# Patient Record
Sex: Female | Born: 1950 | Race: White | Hispanic: No | Marital: Married | Smoking: Former smoker
Health system: Southern US, Community
[De-identification: ages and names within clinical notes are randomized; demographics above are authoritative.]

## PROBLEM LIST (undated history)

## (undated) DIAGNOSIS — E785 Hyperlipidemia, unspecified: Secondary | ICD-10-CM

## (undated) DIAGNOSIS — K219 Gastro-esophageal reflux disease without esophagitis: Secondary | ICD-10-CM

## (undated) DIAGNOSIS — E039 Hypothyroidism, unspecified: Secondary | ICD-10-CM

## (undated) DIAGNOSIS — I219 Acute myocardial infarction, unspecified: Secondary | ICD-10-CM

## (undated) DIAGNOSIS — Z9889 Other specified postprocedural states: Secondary | ICD-10-CM

## (undated) DIAGNOSIS — I071 Rheumatic tricuspid insufficiency: Secondary | ICD-10-CM

## (undated) DIAGNOSIS — I2510 Atherosclerotic heart disease of native coronary artery without angina pectoris: Secondary | ICD-10-CM

## (undated) DIAGNOSIS — I340 Nonrheumatic mitral (valve) insufficiency: Secondary | ICD-10-CM

## (undated) DIAGNOSIS — E119 Type 2 diabetes mellitus without complications: Secondary | ICD-10-CM

## (undated) DIAGNOSIS — M1712 Unilateral primary osteoarthritis, left knee: Secondary | ICD-10-CM

## (undated) DIAGNOSIS — Z8709 Personal history of other diseases of the respiratory system: Secondary | ICD-10-CM

## (undated) DIAGNOSIS — I119 Hypertensive heart disease without heart failure: Secondary | ICD-10-CM

## (undated) DIAGNOSIS — I351 Nonrheumatic aortic (valve) insufficiency: Secondary | ICD-10-CM

## (undated) DIAGNOSIS — C801 Malignant (primary) neoplasm, unspecified: Secondary | ICD-10-CM

## (undated) DIAGNOSIS — I10 Essential (primary) hypertension: Secondary | ICD-10-CM

## (undated) DIAGNOSIS — J189 Pneumonia, unspecified organism: Secondary | ICD-10-CM

## (undated) DIAGNOSIS — R112 Nausea with vomiting, unspecified: Secondary | ICD-10-CM

## (undated) DIAGNOSIS — I517 Cardiomegaly: Secondary | ICD-10-CM

## (undated) HISTORY — DX: Unilateral primary osteoarthritis, left knee: M17.12

## (undated) HISTORY — PX: THYROIDECTOMY, PARTIAL: SHX18

## (undated) HISTORY — PX: TUMOR REMOVAL: SHX12

## (undated) HISTORY — PX: COLONOSCOPY: SHX174

## (undated) HISTORY — PX: TONSILLECTOMY AND ADENOIDECTOMY: SUR1326

## (undated) HISTORY — PX: HEEL SPUR SURGERY: SHX665

## (undated) HISTORY — PX: UPPER GI ENDOSCOPY: SHX6162

## (undated) HISTORY — PX: OTHER SURGICAL HISTORY: SHX169

## (undated) HISTORY — PX: VAGINAL HYSTERECTOMY: SUR661

---

## 2002-07-01 DIAGNOSIS — I219 Acute myocardial infarction, unspecified: Secondary | ICD-10-CM

## 2002-07-01 HISTORY — DX: Acute myocardial infarction, unspecified: I21.9

## 2017-01-26 ENCOUNTER — Ambulatory Visit: Payer: Self-pay | Admitting: Orthopedic Surgery

## 2017-02-02 ENCOUNTER — Encounter: Payer: Self-pay | Admitting: Orthopedic Surgery

## 2017-02-02 ENCOUNTER — Ambulatory Visit: Payer: Self-pay | Admitting: Orthopedic Surgery

## 2017-02-02 DIAGNOSIS — M1712 Unilateral primary osteoarthritis, left knee: Secondary | ICD-10-CM | POA: Insufficient documentation

## 2017-02-02 HISTORY — DX: Unilateral primary osteoarthritis, left knee: M17.12

## 2017-02-02 NOTE — H&P (View-Only) (Signed)
TOTAL KNEE ADMISSION H&P  Patient is being admitted for left total knee arthroplasty.  Subjective:  Chief Complaint:left knee pain.  HPI: Pamela Burns, 66 y.o. female, has a history of pain and functional disability in the left knee due to arthritis and has failed non-surgical conservative treatments for greater than 12 weeks to includeNSAID's and/or analgesics, corticosteriod injections, flexibility and strengthening excercises, supervised PT with diminished ADL's post treatment, use of assistive devices, weight reduction as appropriate and activity modification.  Onset of symptoms was gradual, starting 5 years ago with gradually worsening course since that time. The patient noted no past surgery on the left knee(s).  Patient currently rates pain in the left knee(s) at 10 out of 10 with activity. Patient has night pain, worsening of pain with activity and weight bearing, pain that interferes with activities of daily living, pain with passive range of motion, crepitus and joint swelling.  Patient has evidence of subchondral cysts, subchondral sclerosis, periarticular osteophytes and joint space narrowing by imaging studies. There is no active infection.  Patient Active Problem List   Diagnosis Date Noted  . Osteoarthritis of left knee 02/02/2017   Past Medical History:  Diagnosis Date  . Aortic insufficiency    trace  . Cancer [facility])    thyroid right side  . Coronary artery disease   . Diabetes mellitus without complication ([place])   . GERD (gastroesophageal reflux disease)   . Hypertension   . Hypertensive heart disease   . Hypothyroidism   . Mitral insufficiency    trace  . Moderate concentric left ventricular hypertrophy   . Myocardial infarction ([place]) 07/01/2002  . Osteoarthritis of left knee 02/02/2017  . Osteoarthritis of left knee   . Pneumonia    history of  . PONV (postoperative nausea and vomiting)   . Tricuspid insufficiency    trace    Past Surgical History:  Procedure  Laterality Date  . COLONOSCOPY    . HEEL SPUR SURGERY    . right wrist surgery    . thumb fracture surgery    . THYROIDECTOMY, PARTIAL    . TONSILLECTOMY AND ADENOIDECTOMY    . TUMOR REMOVAL     lieomyoma  . UPPER GI ENDOSCOPY    . VAGINAL HYSTERECTOMY      Current Outpatient Medications  Medication Sig Dispense Refill Last Dose  . acetaminophen (TYLENOL) 500 MG tablet Take 1,000 mg by mouth every 6 (six) hours as needed for moderate pain or headache.     . clopidogrel (PLAVIX) 75 MG tablet Take 75 mg by mouth daily.     . diclofenac sodium (VOLTAREN) 1 % GEL Apply 1 application topically 3 (three) times daily.     Marland Kitchen esomeprazole (NEXIUM 24HR) 20 MG capsule Take 40 mg by mouth 2 (two) times daily.     Marland Kitchen levothyroxine (SYNTHROID, LEVOTHROID) 100 MCG tablet Take 100 mcg by mouth daily before breakfast.     . metFORMIN (GLUCOPHAGE) 500 MG tablet Take 500 mg by mouth daily with breakfast.     . metoprolol tartrate (LOPRESSOR) 50 MG tablet Take 50 mg by mouth daily.     . ranitidine (ZANTAC) 150 MG tablet Take 300 mg by mouth daily at 12 noon.     . rosuvastatin (CRESTOR) 5 MG tablet Take 5 mg by mouth daily.      No current facility-administered medications for this visit.    Facility-Administered Medications Ordered in Other Visits  Medication Dose Route Frequency Provider Last Rate Last Dose  .  chlorhexidine (HIBICLENS) 4 % liquid 4 application  60 mL Topical Once Rod Can, MD       Allergies  Allergen Reactions  . Shellfish Allergy Anaphylaxis and Swelling  . Benadryl [Diphenhydramine] Hives  . Codeine Nausea Only  . Lipitor [Atorvastatin] Other (See Comments)    Chest pain  . Lortab [Hydrocodone-Acetaminophen] Nausea And Vomiting  . Zocor [Simvastatin] Other (See Comments)    Chest pain    Social History   Tobacco Use  . Smoking status: Never Smoker  . Smokeless tobacco: Never Used  Substance Use Topics  . Alcohol use: No    Frequency: Never    No family  history on file.   Review of Systems  Constitutional: Negative.   HENT: Negative.   Eyes: Negative.   Respiratory: Negative.   Cardiovascular: Negative.   Gastrointestinal: Negative.   Genitourinary: Negative.   Musculoskeletal: Positive for joint pain.  Skin: Negative.   Neurological: Negative.   Endo/Heme/Allergies: Negative.   Psychiatric/Behavioral: Negative.     Objective:  Physical Exam  Vitals reviewed. Constitutional: She is oriented to person, place, and time. She appears well-developed and well-nourished.  HENT:  Head: Normocephalic and atraumatic.  Eyes: Conjunctivae and EOM are normal. Pupils are equal, round, and reactive to light.  Neck: Normal range of motion.  Cardiovascular: Normal rate, regular rhythm and intact distal pulses.  Respiratory: Effort normal. No respiratory distress.  GI: Soft. She exhibits no distension.  Genitourinary:  Genitourinary Comments: deferred  Musculoskeletal:       Left knee: She exhibits decreased range of motion, swelling, effusion and abnormal alignment. Tenderness found. Medial joint line and lateral joint line tenderness noted.  Neurological: She is alert and oriented to person, place, and time. She has normal reflexes.  Skin: Skin is warm and dry.  Psychiatric: She has a normal mood and affect. Her behavior is normal. Judgment and thought content normal.    Vital signs in last 24 hours: @VSRANGES @  Labs:   Estimated body mass index is 26.71 kg/m (pended) as calculated from the following:   Height as of 02/17/17: (P) 5\' 6"  (1.676 m).   Weight as of 02/17/17: (P) 75.1 kg (165 lb 8 oz).   Imaging Review Plain radiographs demonstrate severe degenerative joint disease of the left knee(s). The overall alignment issignificant varus. The bone quality appears to be adequate for age and reported activity level.  Assessment/Plan:  End stage arthritis, left knee   The patient history, physical examination, clinical  judgment of the provider and imaging studies are consistent with end stage degenerative joint disease of the left knee(s) and total knee arthroplasty is deemed medically necessary. The treatment options including medical management, injection therapy arthroscopy and arthroplasty were discussed at length. The risks and benefits of total knee arthroplasty were presented and reviewed. The risks due to aseptic loosening, infection, stiffness, patella tracking problems, thromboembolic complications and other imponderables were discussed. The patient acknowledged the explanation, agreed to proceed with the plan and consent was signed. Patient is being admitted for inpatient treatment for surgery, pain control, PT, OT, prophylactic antibiotics, VTE prophylaxis, progressive ambulation and ADL's and discharge planning. The patient is planning to be discharged home with home health services. Has DME.

## 2017-02-02 NOTE — H&P (Signed)
TOTAL KNEE ADMISSION H&P  Patient is being admitted for left total knee arthroplasty.  Subjective:  Chief Complaint:left knee pain.  HPI: Pamela Burns, 66 y.o. female, has a history of pain and functional disability in the left knee due to arthritis and has failed non-surgical conservative treatments for greater than 12 weeks to includeNSAID's and/or analgesics, corticosteriod injections, flexibility and strengthening excercises, supervised PT with diminished ADL's post treatment, use of assistive devices, weight reduction as appropriate and activity modification.  Onset of symptoms was gradual, starting 5 years ago with gradually worsening course since that time. The patient noted no past surgery on the left knee(s).  Patient currently rates pain in the left knee(s) at 10 out of 10 with activity. Patient has night pain, worsening of pain with activity and weight bearing, pain that interferes with activities of daily living, pain with passive range of motion, crepitus and joint swelling.  Patient has evidence of subchondral cysts, subchondral sclerosis, periarticular osteophytes and joint space narrowing by imaging studies. There is no active infection.  Patient Active Problem List   Diagnosis Date Noted  . Osteoarthritis of left knee 02/02/2017   Past Medical History:  Diagnosis Date  . Aortic insufficiency    trace  . 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 Coronary artery disease   . Diabetes mellitus without complication ([place])   . GERD (gastroesophageal reflux disease)   . Hypertension   . Hypertensive heart disease   . Hypothyroidism   . Mitral insufficiency    trace  . Moderate concentric left ventricular hypertrophy   . Myocardial infarction ([place]) 07/01/2002  . Osteoarthritis of left knee 02/02/2017  . Osteoarthritis of left knee   . Pneumonia    history of  . PONV (postoperative nausea and vomiting)   . Tricuspid insufficiency    trace    Past Surgical History:  Procedure  Laterality Date  . COLONOSCOPY    . HEEL SPUR SURGERY    . right wrist surgery    . thumb fracture surgery    . THYROIDECTOMY, PARTIAL    . TONSILLECTOMY AND ADENOIDECTOMY    . TUMOR REMOVAL     lieomyoma  . UPPER GI ENDOSCOPY    . VAGINAL HYSTERECTOMY      Current Outpatient Medications  Medication Sig Dispense Refill Last Dose  . acetaminophen (TYLENOL) 500 MG tablet Take 1,000 mg by mouth every 6 (six) hours as needed for moderate pain or headache.     . clopidogrel (PLAVIX) 75 MG tablet Take 75 mg by mouth daily.     . diclofenac sodium (VOLTAREN) 1 % GEL Apply 1 application topically 3 (three) times daily.     Marland Kitchen esomeprazole (NEXIUM 24HR) 20 MG capsule Take 40 mg by mouth 2 (two) times daily.     Marland Kitchen levothyroxine (SYNTHROID, LEVOTHROID) 100 MCG tablet Take 100 mcg by mouth daily before breakfast.     . metFORMIN (GLUCOPHAGE) 500 MG tablet Take 500 mg by mouth daily with breakfast.     . metoprolol tartrate (LOPRESSOR) 50 MG tablet Take 50 mg by mouth daily.     . ranitidine (ZANTAC) 150 MG tablet Take 300 mg by mouth daily at 12 noon.     . rosuvastatin (CRESTOR) 5 MG tablet Take 5 mg by mouth daily.      No current facility-administered medications for this visit.    Facility-Administered Medications Ordered in Other Visits  Medication Dose Route Frequency Provider Last Rate Last Dose  .  chlorhexidine (HIBICLENS) 4 % liquid 4 application  60 mL Topical Once Rod Can, MD       Allergies  Allergen Reactions  . Shellfish Allergy Anaphylaxis and Swelling  . Benadryl [Diphenhydramine] Hives  . Codeine Nausea Only  . Lipitor [Atorvastatin] Other (See Comments)    Chest pain  . Lortab [Hydrocodone-Acetaminophen] Nausea And Vomiting  . Zocor [Simvastatin] Other (See Comments)    Chest pain    Social History   Tobacco Use  . Smoking status: Never Smoker  . Smokeless tobacco: Never Used  Substance Use Topics  . Alcohol use: No    Frequency: Never    No family  history on file.   Review of Systems  Constitutional: Negative.   HENT: Negative.   Eyes: Negative.   Respiratory: Negative.   Cardiovascular: Negative.   Gastrointestinal: Negative.   Genitourinary: Negative.   Musculoskeletal: Positive for joint pain.  Skin: Negative.   Neurological: Negative.   Endo/Heme/Allergies: Negative.   Psychiatric/Behavioral: Negative.     Objective:  Physical Exam  Vitals reviewed. Constitutional: She is oriented to person, place, and time. She appears well-developed and well-nourished.  HENT:  Head: Normocephalic and atraumatic.  Eyes: Conjunctivae and EOM are normal. Pupils are equal, round, and reactive to light.  Neck: Normal range of motion.  Cardiovascular: Normal rate, regular rhythm and intact distal pulses.  Respiratory: Effort normal. No respiratory distress.  GI: Soft. She exhibits no distension.  Genitourinary:  Genitourinary Comments: deferred  Musculoskeletal:       Left knee: She exhibits decreased range of motion, swelling, effusion and abnormal alignment. Tenderness found. Medial joint line and lateral joint line tenderness noted.  Neurological: She is alert and oriented to person, place, and time. She has normal reflexes.  Skin: Skin is warm and dry.  Psychiatric: She has a normal mood and affect. Her behavior is normal. Judgment and thought content normal.    Vital signs in last 24 hours: @VSRANGES @  Labs:   Estimated body mass index is 26.71 kg/m (pended) as calculated from the following:   Height as of 02/17/17: (P) 5\' 6"  (1.676 m).   Weight as of 02/17/17: (P) 75.1 kg (165 lb 8 oz).   Imaging Review Plain radiographs demonstrate severe degenerative joint disease of the left knee(s). The overall alignment issignificant varus. The bone quality appears to be adequate for age and reported activity level.  Assessment/Plan:  End stage arthritis, left knee   The patient history, physical examination, clinical  judgment of the provider and imaging studies are consistent with end stage degenerative joint disease of the left knee(s) and total knee arthroplasty is deemed medically necessary. The treatment options including medical management, injection therapy arthroscopy and arthroplasty were discussed at length. The risks and benefits of total knee arthroplasty were presented and reviewed. The risks due to aseptic loosening, infection, stiffness, patella tracking problems, thromboembolic complications and other imponderables were discussed. The patient acknowledged the explanation, agreed to proceed with the plan and consent was signed. Patient is being admitted for inpatient treatment for surgery, pain control, PT, OT, prophylactic antibiotics, VTE prophylaxis, progressive ambulation and ADL's and discharge planning. The patient is planning to be discharged home with home health services. Has DME.

## 2017-02-15 ENCOUNTER — Encounter (HOSPITAL_COMMUNITY): Payer: Self-pay

## 2017-02-15 NOTE — Pre-Procedure Instructions (Signed)
Surgical clearance in Nov. 2018 from Dr. Michell Heinrich in chart.

## 2017-02-15 NOTE — Patient Instructions (Addendum)
Pamela Burns  02/15/2017   Your procedure is scheduled on: Thursday, Dec. 27, 2018   Report to [facility] Main  Entrance   Take [place]  elevators to 3rd floor to  [place] at 10:30 AM.    Call this number if you have problems the morning of surgery [phone]   Remember: ONLY 1 PERSON MAY GO WITH YOU TO SHORT STAY TO GET  READY MORNING OF Pamela Burns.   Do not eat food or drink liquids :After Midnight.    Take these medicines the morning of surgery with A SIP OF WATER: Nexium, Levothyroxine,   DO NOT TAKE ANY DIABETIC MEDICATIONS DAY OF YOUR SURGERY                               You may not have any metal on your body including hair pins, jewelry, and body piercings               Do not wear make-up, lotions, powders, perfumes, or deodorant             Do not wear nail polish.  Do not shave  48 hours prior to surgery.                 Do not bring valuables to the hospital. [place].   Contacts, dentures or bridgework may not be worn into surgery.   Leave suitcase in the car. After surgery it may be brought to your room.               Please read over the following fact sheets you were given: _____________________________________________________________________        [facility] - Preparing for Surgery Before surgery, you can play an important role.  Because skin is not sterile, your skin needs to be as free of germs as possible.  You can reduce the number of germs on your skin by washing with CHG (chlorahexidine gluconate) soap before surgery.  CHG is an antiseptic cleaner which kills germs and bonds with the skin to continue killing germs even after washing. Please DO NOT use if you have an allergy to CHG or antibacterial soaps.  If your skin becomes reddened/irritated stop using the CHG and inform your nurse when you arrive at Short Stay. Do not shave (including legs and underarms) for at  least 48 hours prior to the first CHG shower.  You may shave your face/neck.  Please follow these instructions carefully:  1.  Shower with CHG Soap the night before surgery and the  morning of surgery.  2.  If you choose to wash your hair, wash your hair first as usual with your normal  shampoo.  3.  After you shampoo, rinse your hair and body thoroughly to remove the shampoo.                             4.  Use CHG as you would any other liquid soap.  You can apply chg directly to the skin and wash.  Gently with a scrungie or clean washcloth.  5.  Apply the CHG Soap to your body ONLY FROM THE NECK DOWN.   Do  not use on face/ open                           Wound or open sores. Avoid contact with eyes, ears mouth and   genitals (private parts).                       Wash face,  Genitals (private parts) with your normal soap.             6.  Wash thoroughly, paying special attention to the area where your    surgery  will be performed.  7.  Thoroughly rinse your body with warm water from the neck down.  8.  DO NOT shower/wash with your normal soap after using and rinsing off the CHG Soap.                9.  Pat yourself dry with a clean towel.            10.  Wear clean pajamas.            11.  Place clean sheets on your bed the night of your first shower and do not  sleep with pets. Day of Surgery : Do not apply any lotions/deodorants the morning of surgery.  Please wear clean clothes to the hospital/surgery center.  FAILURE TO FOLLOW THESE INSTRUCTIONS MAY RESULT IN THE CANCELLATION OF YOUR SURGERY  PATIENT SIGNATURE_________________________________  NURSE SIGNATURE__________________________________  ________________________________________________________________________   Pamela Burns  An incentive spirometer is a tool that can help keep your lungs clear and active. This tool measures how well you are filling your lungs with each breath. Taking long deep breaths may help  reverse or decrease the chance of developing breathing (pulmonary) problems (especially infection) following:  A long period of time when you are unable to move or be active. BEFORE THE PROCEDURE   If the spirometer includes an indicator to show your best effort, your nurse or respiratory therapist will set it to a desired goal.  If possible, sit up straight or lean slightly forward. Try not to slouch.  Hold the incentive spirometer in an upright position. INSTRUCTIONS FOR USE  1. Sit on the edge of your bed if possible, or sit up as far as you can in bed or on a chair. 2. Hold the incentive spirometer in an upright position. 3. Breathe out normally. 4. Place the mouthpiece in your mouth and seal your lips tightly around it. 5. Breathe in slowly and as deeply as possible, raising the piston or the ball toward the top of the column. 6. Hold your breath for 3-5 seconds or for as long as possible. Allow the piston or ball to fall to the bottom of the column. 7. Remove the mouthpiece from your mouth and breathe out normally. 8. Rest for a few seconds and repeat Steps 1 through 7 at least 10 times every 1-2 hours when you are awake. Take your time and take a few normal breaths between deep breaths. 9. The spirometer may include an indicator to show your best effort. Use the indicator as a goal to work toward during each repetition. 10. After each set of 10 deep breaths, practice coughing to be sure your lungs are clear. If you have an incision (the cut made at the time of surgery), support your incision when coughing by placing a pillow or rolled up towels firmly against it. Once you are able  to get out of bed, walk around indoors and cough well. You may stop using the incentive spirometer when instructed by your caregiver.  RISKS AND COMPLICATIONS  Take your time so you do not get dizzy or light-headed.  If you are in pain, you may need to take or ask for pain medication before doing incentive  spirometry. It is harder to take a deep breath if you are having pain. AFTER USE  Rest and breathe slowly and easily.  It can be helpful to keep track of a log of your progress. Your caregiver can provide you with a simple table to help with this. If you are using the spirometer at home, follow these instructions: [place] IF:   You are having difficultly using the spirometer.  You have trouble using the spirometer as often as instructed.  Your pain medication is not giving enough relief while using the spirometer.  You develop fever of 100.5 F (38.1 C) or higher. SEEK IMMEDIATE MEDICAL CARE IF:   You cough up bloody sputum that had not been present before.  You develop fever of 102 F (38.9 C) or greater.  You develop worsening pain at or near the incision site. MAKE SURE YOU:   Understand these instructions.  Will watch your condition.  Will get help right away if you are not doing well or get worse. Document Released: 06/29/2006 Document Revised: 05/11/2011 Document Reviewed: 08/30/2006 Exit[employer] Patient Information 2014 ExitCare, [place].   ________________________________________________________________________  WHAT IS A BLOOD TRANSFUSION? Blood Transfusion Information  A transfusion is the replacement of blood or some of its parts. Blood is made up of multiple cells which provide different functions.  Red blood cells carry oxygen and are used for blood loss replacement.  White blood cells fight against infection.  Platelets control bleeding.  Plasma helps clot blood.  Other blood products are available for specialized needs, such as hemophilia or other clotting disorders. BEFORE THE TRANSFUSION  Who gives blood for transfusions?   Healthy volunteers who are fully evaluated to make sure their blood is safe. This is blood bank blood. Transfusion therapy is the safest it has ever been in the practice of medicine. Before blood is taken from a donor, a  complete history is taken to make sure that person has no history of diseases nor engages in risky social behavior (examples are intravenous drug use or sexual activity with multiple partners). The donor's travel history is screened to minimize risk of transmitting infections, such as malaria. The donated blood is tested for signs of infectious diseases, such as HIV and hepatitis. The blood is then tested to be sure it is compatible with you in order to minimize the chance of a transfusion reaction. If you or a relative donates blood, this is often done in anticipation of surgery and is not appropriate for emergency situations. It takes many days to process the donated blood. RISKS AND COMPLICATIONS Although transfusion therapy is very safe and saves many lives, the main dangers of transfusion include:   Getting an infectious disease.  Developing a transfusion reaction. This is an allergic reaction to something in the blood you were given. Every precaution is taken to prevent this. The decision to have a blood transfusion has been considered carefully by your caregiver before blood is given. Blood is not given unless the benefits outweigh the risks. AFTER THE TRANSFUSION  Right after receiving a blood transfusion, you will usually feel much better and more energetic. This is especially true  if your red blood cells have gotten low (anemic). The transfusion raises the level of the red blood cells which carry oxygen, and this usually causes an energy increase.  The nurse administering the transfusion will monitor you carefully for complications. HOME CARE INSTRUCTIONS  No special instructions are needed after a transfusion. You may find your energy is better. Speak with your caregiver about any limitations on activity for underlying diseases you may have. SEEK MEDICAL CARE IF:   Your condition is not improving after your transfusion.  You develop redness or irritation at the intravenous (IV)  site. SEEK IMMEDIATE MEDICAL CARE IF:  Any of the following symptoms occur over the next 12 hours:  Shaking chills.  You have a temperature by mouth above 102 F (38.9 C), not controlled by medicine.  Chest, back, or muscle pain.  People around you feel you are not acting correctly or are confused.  Shortness of breath or difficulty breathing.  Dizziness and fainting.  You get a rash or develop hives.  You have a decrease in urine output.  Your urine turns a dark color or changes to pink, red, or brown. Any of the following symptoms occur over the next 10 days:  You have a temperature by mouth above 102 F (38.9 C), not controlled by medicine.  Shortness of breath.  Weakness after normal activity.  The white part of the eye turns yellow (jaundice).  You have a decrease in the amount of urine or are urinating less often.  Your urine turns a dark color or changes to pink, red, or brown. Document Released: 02/14/2000 Document Revised: 05/11/2011 Document Reviewed: 10/03/2007 [facility] Patient Information 2014 [place], [place].  _______________________________________________________________________

## 2017-02-15 NOTE — Pre-Procedure Instructions (Signed)
Echo report from 01/04/17 in chart

## 2017-02-17 ENCOUNTER — Other Ambulatory Visit: Payer: Self-pay

## 2017-02-17 ENCOUNTER — Encounter (HOSPITAL_COMMUNITY): Payer: Self-pay

## 2017-02-17 ENCOUNTER — Encounter (HOSPITAL_COMMUNITY)
Admission: RE | Admit: 2017-02-17 | Discharge: 2017-02-17 | Disposition: A | Discharge status: Home / Self Care | Payer: Medicare Other | Source: Ambulatory Visit | Attending: Orthopedic Surgery | Admitting: Orthopedic Surgery

## 2017-02-17 DIAGNOSIS — Z01810 Encounter for preprocedural cardiovascular examination: Secondary | ICD-10-CM | POA: Diagnosis not present

## 2017-02-17 DIAGNOSIS — E119 Type 2 diabetes mellitus without complications: Secondary | ICD-10-CM | POA: Diagnosis not present

## 2017-02-17 DIAGNOSIS — Z01812 Encounter for preprocedural laboratory examination: Secondary | ICD-10-CM | POA: Diagnosis present

## 2017-02-17 HISTORY — DX: Type 2 diabetes mellitus without complications: E11.9

## 2017-02-17 HISTORY — DX: Nonrheumatic mitral (valve) insufficiency: I34.0

## 2017-02-17 HISTORY — DX: Rheumatic tricuspid insufficiency: I07.1

## 2017-02-17 HISTORY — DX: Nonrheumatic aortic (valve) insufficiency: I35.1

## 2017-02-17 HISTORY — DX: Nausea with vomiting, unspecified: R11.2

## 2017-02-17 HISTORY — DX: Pneumonia, unspecified organism: J18.9

## 2017-02-17 HISTORY — DX: Acute myocardial infarction, unspecified: I21.9

## 2017-02-17 HISTORY — DX: Nausea with vomiting, unspecified: Z98.890

## 2017-02-17 HISTORY — DX: Cardiomegaly: I51.7

## 2017-02-17 HISTORY — DX: Malignant (primary) neoplasm, unspecified: C80.1

## 2017-02-17 HISTORY — DX: Hypothyroidism, unspecified: E03.9

## 2017-02-17 HISTORY — DX: Essential (primary) hypertension: I10

## 2017-02-17 HISTORY — DX: Hypertensive heart disease without heart failure: I11.9

## 2017-02-17 HISTORY — DX: Atherosclerotic heart disease of native coronary artery without angina pectoris: I25.10

## 2017-02-17 HISTORY — DX: Gastro-esophageal reflux disease without esophagitis: K21.9

## 2017-02-17 LAB — CBC
HCT: 33 % — ABNORMAL LOW (ref 36.0–46.0)
Hemoglobin: 11.2 g/dL — ABNORMAL LOW (ref 12.0–15.0)
MCH: 28.7 pg (ref 26.0–34.0)
MCHC: 33.9 g/dL (ref 30.0–36.0)
MCV: 84.6 fL (ref 78.0–100.0)
PLATELETS: 269 10*3/uL (ref 150–400)
RBC: 3.9 MIL/uL (ref 3.87–5.11)
RDW: 12.7 % (ref 11.5–15.5)
WBC: 6.5 10*3/uL (ref 4.0–10.5)

## 2017-02-17 LAB — SURGICAL PCR SCREEN
MRSA, PCR: NEGATIVE
STAPHYLOCOCCUS AUREUS: NEGATIVE

## 2017-02-17 LAB — BASIC METABOLIC PANEL
Anion gap: 7 (ref 5–15)
BUN: 11 mg/dL (ref 6–20)
CALCIUM: 9.2 mg/dL (ref 8.9–10.3)
CHLORIDE: 106 mmol/L (ref 101–111)
CO2: 25 mmol/L (ref 22–32)
CREATININE: 0.57 mg/dL (ref 0.44–1.00)
GFR calc Af Amer: >60 mL/min (ref >60)
GFR calc non Af Amer: >60 mL/min (ref >60)
Glucose, Bld: 131 mg/dL — ABNORMAL HIGH (ref 65–99)
Potassium: 4 mmol/L (ref 3.5–5.1)
SODIUM: 138 mmol/L (ref 135–145)

## 2017-02-17 LAB — HEMOGLOBIN A1C
HEMOGLOBIN A1C: 6.4 % — AB (ref 4.8–5.6)
Mean Plasma Glucose: 136.98 mg/dL

## 2017-02-17 LAB — ABO/RH: ABO/RH(D): A POS

## 2017-02-17 LAB — GLUCOSE, CAPILLARY: Glucose-Capillary: 136 mg/dL — ABNORMAL HIGH (ref 65–99)

## 2017-02-17 MED ORDER — CHLORHEXIDINE GLUCONATE 4 % EX LIQD
60.0000 mL | Freq: Once | CUTANEOUS | Status: DC
  Filled 2017-02-17: qty 60

## 2017-02-17 NOTE — Pre-Procedure Instructions (Signed)
CBC, BMP, HGB A1C results faxed to Dr. Lyla Glassing via epic.

## 2017-02-25 ENCOUNTER — Inpatient Hospital Stay (HOSPITAL_COMMUNITY): Payer: Medicare Other

## 2017-02-25 ENCOUNTER — Inpatient Hospital Stay (HOSPITAL_COMMUNITY): Payer: Medicare Other | Admitting: Anesthesiology

## 2017-02-25 ENCOUNTER — Inpatient Hospital Stay (HOSPITAL_COMMUNITY)
Admission: RE | Admit: 2017-02-25 | Discharge: 2017-02-27 | DRG: 470 | Disposition: A | Discharge status: Home / Self Care | Payer: Medicare Other | Source: Ambulatory Visit | Attending: Orthopedic Surgery | Admitting: Orthopedic Surgery

## 2017-02-25 ENCOUNTER — Encounter (HOSPITAL_COMMUNITY): Payer: Self-pay | Admitting: *Deleted

## 2017-02-25 ENCOUNTER — Encounter (HOSPITAL_COMMUNITY)
Admission: RE | Disposition: A | Discharge status: Home / Self Care | Payer: Self-pay | Source: Ambulatory Visit | Attending: Orthopedic Surgery

## 2017-02-25 ENCOUNTER — Other Ambulatory Visit: Payer: Self-pay

## 2017-02-25 DIAGNOSIS — E039 Hypothyroidism, unspecified: Secondary | ICD-10-CM | POA: Diagnosis present

## 2017-02-25 DIAGNOSIS — Z87891 Personal history of nicotine dependence: Secondary | ICD-10-CM

## 2017-02-25 DIAGNOSIS — I119 Hypertensive heart disease without heart failure: Secondary | ICD-10-CM | POA: Diagnosis present

## 2017-02-25 DIAGNOSIS — I2510 Atherosclerotic heart disease of native coronary artery without angina pectoris: Secondary | ICD-10-CM | POA: Diagnosis present

## 2017-02-25 DIAGNOSIS — E119 Type 2 diabetes mellitus without complications: Secondary | ICD-10-CM | POA: Diagnosis present

## 2017-02-25 DIAGNOSIS — I083 Combined rheumatic disorders of mitral, aortic and tricuspid valves: Secondary | ICD-10-CM | POA: Diagnosis present

## 2017-02-25 DIAGNOSIS — M1712 Unilateral primary osteoarthritis, left knee: Secondary | ICD-10-CM | POA: Diagnosis present

## 2017-02-25 DIAGNOSIS — Z85850 Personal history of malignant neoplasm of thyroid: Secondary | ICD-10-CM

## 2017-02-25 DIAGNOSIS — K219 Gastro-esophageal reflux disease without esophagitis: Secondary | ICD-10-CM | POA: Diagnosis present

## 2017-02-25 DIAGNOSIS — D649 Anemia, unspecified: Secondary | ICD-10-CM | POA: Diagnosis present

## 2017-02-25 DIAGNOSIS — Z8701 Personal history of pneumonia (recurrent): Secondary | ICD-10-CM

## 2017-02-25 DIAGNOSIS — Z91013 Allergy to seafood: Secondary | ICD-10-CM | POA: Diagnosis not present

## 2017-02-25 DIAGNOSIS — Z888 Allergy status to other drugs, medicaments and biological substances status: Secondary | ICD-10-CM

## 2017-02-25 DIAGNOSIS — Z90710 Acquired absence of both cervix and uterus: Secondary | ICD-10-CM

## 2017-02-25 DIAGNOSIS — Z7902 Long term (current) use of antithrombotics/antiplatelets: Secondary | ICD-10-CM

## 2017-02-25 DIAGNOSIS — Z9089 Acquired absence of other organs: Secondary | ICD-10-CM | POA: Diagnosis not present

## 2017-02-25 DIAGNOSIS — Z96642 Presence of left artificial hip joint: Secondary | ICD-10-CM

## 2017-02-25 DIAGNOSIS — I252 Old myocardial infarction: Secondary | ICD-10-CM

## 2017-02-25 DIAGNOSIS — Z885 Allergy status to narcotic agent status: Secondary | ICD-10-CM | POA: Diagnosis not present

## 2017-02-25 HISTORY — PX: KNEE ARTHROPLASTY: SHX992

## 2017-02-25 LAB — GLUCOSE, CAPILLARY
GLUCOSE-CAPILLARY: 97 mg/dL (ref 65–99)
GLUCOSE-CAPILLARY: 127 mg/dL — AB (ref 65–99)
GLUCOSE-CAPILLARY: 234 mg/dL — AB (ref 65–99)

## 2017-02-25 LAB — PREPARE RBC (CROSSMATCH)

## 2017-02-25 SURGERY — ARTHROPLASTY, KNEE, TOTAL, USING IMAGELESS COMPUTER-ASSISTED NAVIGATION
Anesthesia: Spinal | Site: Knee | Laterality: Left

## 2017-02-25 MED ORDER — PROPOFOL 10 MG/ML IV BOLUS
INTRAVENOUS | Status: AC
  Filled 2017-02-25: qty 20

## 2017-02-25 MED ORDER — ONDANSETRON HCL 4 MG/2ML IJ SOLN
4.0000 mg | Freq: Four times a day (QID) | INTRAMUSCULAR | Status: DC | PRN
  Administered 2017-02-25 – 2017-02-26 (×2): 4 mg via INTRAVENOUS
  Filled 2017-02-25 (×2): qty 2

## 2017-02-25 MED ORDER — METOCLOPRAMIDE HCL 5 MG/ML IJ SOLN: 10.0000 mg | Freq: Once | INTRAMUSCULAR | Status: DC | PRN

## 2017-02-25 MED ORDER — POVIDONE-IODINE 10 % EX SWAB
2.0000 "application " | Freq: Once | CUTANEOUS | Status: AC
  Administered 2017-02-25: 2 via TOPICAL

## 2017-02-25 MED ORDER — PHENYLEPHRINE HCL 10 MG/ML IJ SOLN
INTRAVENOUS | Status: DC | PRN
  Administered 2017-02-25: 20 ug/min via INTRAVENOUS

## 2017-02-25 MED ORDER — CEFAZOLIN SODIUM-DEXTROSE 2-4 GM/100ML-% IV SOLN
2.0000 g | INTRAVENOUS | Status: AC
  Administered 2017-02-25: 2 g via INTRAVENOUS
  Filled 2017-02-25: qty 100

## 2017-02-25 MED ORDER — METHOCARBAMOL 1000 MG/10ML IJ SOLN
500.0000 mg | Freq: Four times a day (QID) | INTRAMUSCULAR | Status: DC | PRN
  Administered 2017-02-25: 500 mg via INTRAVENOUS
  Filled 2017-02-25: qty 550

## 2017-02-25 MED ORDER — DOCUSATE SODIUM 100 MG PO CAPS
100.0000 mg | ORAL_CAPSULE | Freq: Two times a day (BID) | ORAL | Status: DC
  Administered 2017-02-25 – 2017-02-27 (×4): 100 mg via ORAL
  Filled 2017-02-25 (×4): qty 1

## 2017-02-25 MED ORDER — ONDANSETRON HCL 4 MG/2ML IJ SOLN
INTRAMUSCULAR | Status: AC
  Filled 2017-02-25: qty 2

## 2017-02-25 MED ORDER — METOPROLOL TARTRATE 50 MG PO TABS
50.0000 mg | ORAL_TABLET | ORAL | Status: AC
  Administered 2017-02-25: 50 mg via ORAL
  Filled 2017-02-25: qty 1

## 2017-02-25 MED ORDER — FENTANYL CITRATE (PF) 100 MCG/2ML IJ SOLN
INTRAMUSCULAR | Status: AC
Start: 2017-02-25 — End: 2017-02-26
  Filled 2017-02-25: qty 2

## 2017-02-25 MED ORDER — SODIUM CHLORIDE 0.9 % IR SOLN
Status: DC | PRN
  Administered 2017-02-25: 4000 mL

## 2017-02-25 MED ORDER — CEFAZOLIN SODIUM-DEXTROSE 1-4 GM/50ML-% IV SOLN
1.0000 g | Freq: Four times a day (QID) | INTRAVENOUS | Status: AC
  Administered 2017-02-25 – 2017-02-26 (×2): 1 g via INTRAVENOUS
  Filled 2017-02-25 (×2): qty 50

## 2017-02-25 MED ORDER — SODIUM CHLORIDE 0.9 % IV SOLN
INTRAVENOUS | Status: DC
  Administered 2017-02-25 – 2017-02-26 (×2): via INTRAVENOUS

## 2017-02-25 MED ORDER — PROPOFOL 500 MG/50ML IV EMUL
INTRAVENOUS | Status: DC | PRN
  Administered 2017-02-25: 50 ug/kg/min via INTRAVENOUS

## 2017-02-25 MED ORDER — ASPIRIN 81 MG PO CHEW
81.0000 mg | CHEWABLE_TABLET | Freq: Two times a day (BID) | ORAL | Status: DC
  Administered 2017-02-25 – 2017-02-27 (×4): 81 mg via ORAL
  Filled 2017-02-25 (×4): qty 1

## 2017-02-25 MED ORDER — KETOROLAC TROMETHAMINE 30 MG/ML IJ SOLN
INTRAMUSCULAR | Status: DC | PRN
  Administered 2017-02-25: 30 mg

## 2017-02-25 MED ORDER — ONDANSETRON HCL 4 MG/2ML IJ SOLN
INTRAMUSCULAR | Status: DC | PRN
  Administered 2017-02-25: 4 mg via INTRAVENOUS

## 2017-02-25 MED ORDER — KETOROLAC TROMETHAMINE 15 MG/ML IJ SOLN
15.0000 mg | Freq: Four times a day (QID) | INTRAMUSCULAR | Status: AC
  Administered 2017-02-25 – 2017-02-26 (×4): 15 mg via INTRAVENOUS
  Filled 2017-02-25 (×4): qty 1

## 2017-02-25 MED ORDER — ROPIVACAINE HCL 7.5 MG/ML IJ SOLN
INTRAMUSCULAR | Status: DC | PRN
  Administered 2017-02-25: 20 mL via PERINEURAL

## 2017-02-25 MED ORDER — SODIUM CHLORIDE 0.9 % IJ SOLN
INTRAMUSCULAR | Status: DC | PRN
  Administered 2017-02-25: 30 mL

## 2017-02-25 MED ORDER — DIPHENHYDRAMINE HCL 12.5 MG/5ML PO ELIX: 12.5000 mg | ORAL_SOLUTION | ORAL | Status: DC | PRN

## 2017-02-25 MED ORDER — METOPROLOL TARTRATE 50 MG PO TABS
50.0000 mg | ORAL_TABLET | Freq: Every day | ORAL | Status: DC
  Administered 2017-02-26 – 2017-02-27 (×2): 50 mg via ORAL
  Filled 2017-02-25 (×2): qty 1

## 2017-02-25 MED ORDER — STERILE WATER FOR IRRIGATION IR SOLN
Status: DC | PRN
  Administered 2017-02-25: 2000 mL

## 2017-02-25 MED ORDER — ONDANSETRON HCL 4 MG PO TABS: 4.0000 mg | ORAL_TABLET | Freq: Four times a day (QID) | ORAL | Status: DC | PRN

## 2017-02-25 MED ORDER — INSULIN ASPART 100 UNIT/ML ~~LOC~~ SOLN
0.0000 [IU] | Freq: Three times a day (TID) | SUBCUTANEOUS | Status: DC
  Administered 2017-02-26: 18:00:00 5 [IU] via SUBCUTANEOUS

## 2017-02-25 MED ORDER — DEXAMETHASONE SODIUM PHOSPHATE 10 MG/ML IJ SOLN
INTRAMUSCULAR | Status: AC
  Filled 2017-02-25: qty 1

## 2017-02-25 MED ORDER — ISOPROPYL ALCOHOL 70 % SOLN
Status: DC | PRN
  Administered 2017-02-25: 1 via TOPICAL

## 2017-02-25 MED ORDER — MEPERIDINE HCL 50 MG/ML IJ SOLN: 6.2500 mg | INTRAMUSCULAR | Status: DC | PRN

## 2017-02-25 MED ORDER — MENTHOL 3 MG MT LOZG
1.0000 | LOZENGE | OROMUCOSAL | Status: DC | PRN
Start: 2017-02-25 — End: 2017-02-27

## 2017-02-25 MED ORDER — ALUM & MAG HYDROXIDE-SIMETH 200-200-20 MG/5ML PO SUSP: 30.0000 mL | ORAL | Status: DC | PRN

## 2017-02-25 MED ORDER — SODIUM CHLORIDE 0.9 % IV SOLN: INTRAVENOUS | Status: DC

## 2017-02-25 MED ORDER — METFORMIN HCL 500 MG PO TABS
500.0000 mg | ORAL_TABLET | Freq: Every day | ORAL | Status: DC
  Administered 2017-02-26 – 2017-02-27 (×2): 500 mg via ORAL
  Filled 2017-02-25 (×2): qty 1

## 2017-02-25 MED ORDER — ISOPROPYL ALCOHOL 70 % SOLN
Status: AC
  Filled 2017-02-25: qty 480

## 2017-02-25 MED ORDER — POLYETHYLENE GLYCOL 3350 17 G PO PACK: 17.0000 g | PACK | Freq: Every day | ORAL | Status: DC | PRN

## 2017-02-25 MED ORDER — BUPIVACAINE-EPINEPHRINE 0.25% -1:200000 IJ SOLN
INTRAMUSCULAR | Status: DC | PRN
  Administered 2017-02-25: 30 mL

## 2017-02-25 MED ORDER — LACTATED RINGERS IV SOLN: INTRAVENOUS | Status: DC

## 2017-02-25 MED ORDER — ACETAMINOPHEN 10 MG/ML IV SOLN
1000.0000 mg | INTRAVENOUS | Status: AC
  Administered 2017-02-25: 1000 mg via INTRAVENOUS
  Filled 2017-02-25: qty 100

## 2017-02-25 MED ORDER — MIDAZOLAM HCL 2 MG/2ML IJ SOLN
INTRAMUSCULAR | Status: AC
  Filled 2017-02-25: qty 2

## 2017-02-25 MED ORDER — MIDAZOLAM HCL 2 MG/2ML IJ SOLN
1.0000 mg | INTRAMUSCULAR | Status: DC | PRN
  Administered 2017-02-25: 2 mg via INTRAVENOUS

## 2017-02-25 MED ORDER — SODIUM CHLORIDE 0.9 % IJ SOLN
INTRAMUSCULAR | Status: AC
  Filled 2017-02-25: qty 50

## 2017-02-25 MED ORDER — ROSUVASTATIN CALCIUM 5 MG PO TABS
5.0000 mg | ORAL_TABLET | Freq: Every day | ORAL | Status: DC
  Administered 2017-02-25 – 2017-02-26 (×2): 5 mg via ORAL
  Filled 2017-02-25 (×2): qty 1

## 2017-02-25 MED ORDER — DEXAMETHASONE SODIUM PHOSPHATE 10 MG/ML IJ SOLN
INTRAMUSCULAR | Status: DC | PRN
  Administered 2017-02-25: 10 mg via INTRAVENOUS

## 2017-02-25 MED ORDER — TRANEXAMIC ACID 1000 MG/10ML IV SOLN
1000.0000 mg | INTRAVENOUS | Status: AC
  Administered 2017-02-25: 1000 mg via INTRAVENOUS
  Filled 2017-02-25: qty 1100

## 2017-02-25 MED ORDER — OXYCODONE HCL 5 MG PO TABS: 5.0000 mg | ORAL_TABLET | ORAL | Status: DC | PRN

## 2017-02-25 MED ORDER — SODIUM CHLORIDE 0.9 % IV SOLN: Freq: Once | INTRAVENOUS | Status: DC

## 2017-02-25 MED ORDER — PANTOPRAZOLE SODIUM 40 MG PO TBEC
80.0000 mg | DELAYED_RELEASE_TABLET | Freq: Every day | ORAL | Status: DC
  Administered 2017-02-26: 13:00:00 80 mg via ORAL
  Filled 2017-02-25: qty 2

## 2017-02-25 MED ORDER — LACTATED RINGERS IV SOLN
INTRAVENOUS | Status: DC
  Administered 2017-02-25 (×2): via INTRAVENOUS

## 2017-02-25 MED ORDER — METHOCARBAMOL 500 MG PO TABS
500.0000 mg | ORAL_TABLET | Freq: Four times a day (QID) | ORAL | Status: DC | PRN
  Administered 2017-02-26 – 2017-02-27 (×5): 500 mg via ORAL
  Filled 2017-02-25 (×5): qty 1

## 2017-02-25 MED ORDER — LEVOTHYROXINE SODIUM 100 MCG PO TABS
100.0000 ug | ORAL_TABLET | Freq: Every day | ORAL | Status: DC
  Administered 2017-02-26 – 2017-02-27 (×2): 100 ug via ORAL
  Filled 2017-02-25 (×2): qty 1

## 2017-02-25 MED ORDER — SENNA 8.6 MG PO TABS
2.0000 | ORAL_TABLET | Freq: Every day | ORAL | Status: DC
  Administered 2017-02-25 – 2017-02-26 (×2): 17.2 mg via ORAL
  Filled 2017-02-25 (×2): qty 2

## 2017-02-25 MED ORDER — FENTANYL CITRATE (PF) 100 MCG/2ML IJ SOLN: 25.0000 ug | INTRAMUSCULAR | Status: DC | PRN

## 2017-02-25 MED ORDER — DEXAMETHASONE SODIUM PHOSPHATE 10 MG/ML IJ SOLN
10.0000 mg | Freq: Once | INTRAMUSCULAR | Status: AC
  Administered 2017-02-26: 11:00:00 10 mg via INTRAVENOUS
  Filled 2017-02-25: qty 1

## 2017-02-25 MED ORDER — ACETAMINOPHEN 325 MG PO TABS
650.0000 mg | ORAL_TABLET | ORAL | Status: DC | PRN
  Administered 2017-02-27: 06:00:00 650 mg via ORAL
  Filled 2017-02-25: qty 2

## 2017-02-25 MED ORDER — PHENYLEPHRINE HCL 10 MG/ML IJ SOLN
INTRAMUSCULAR | Status: AC
  Filled 2017-02-25: qty 1

## 2017-02-25 MED ORDER — TRANEXAMIC ACID 1000 MG/10ML IV SOLN
1000.0000 mg | Freq: Once | INTRAVENOUS | Status: AC
  Administered 2017-02-25: 1000 mg via INTRAVENOUS
  Filled 2017-02-25: qty 1100

## 2017-02-25 MED ORDER — FENTANYL CITRATE (PF) 100 MCG/2ML IJ SOLN
50.0000 ug | INTRAMUSCULAR | Status: DC | PRN
  Administered 2017-02-25: 100 ug via INTRAVENOUS

## 2017-02-25 MED ORDER — BUPIVACAINE HCL (PF) 0.5 % IJ SOLN
INTRAMUSCULAR | Status: DC | PRN
  Administered 2017-02-25: 3 mL

## 2017-02-25 MED ORDER — PHENOL 1.4 % MT LIQD: 1.0000 | OROMUCOSAL | Status: DC | PRN

## 2017-02-25 MED ORDER — CLOPIDOGREL BISULFATE 75 MG PO TABS
75.0000 mg | ORAL_TABLET | Freq: Every day | ORAL | Status: DC
  Administered 2017-02-26 – 2017-02-27 (×2): 75 mg via ORAL
  Filled 2017-02-25 (×2): qty 1

## 2017-02-25 MED ORDER — METOCLOPRAMIDE HCL 5 MG PO TABS: 5.0000 mg | ORAL_TABLET | Freq: Three times a day (TID) | ORAL | Status: DC | PRN

## 2017-02-25 MED ORDER — METOCLOPRAMIDE HCL 5 MG/ML IJ SOLN: 5.0000 mg | Freq: Three times a day (TID) | INTRAMUSCULAR | Status: DC | PRN

## 2017-02-25 MED ORDER — BUPIVACAINE-EPINEPHRINE 0.25% -1:200000 IJ SOLN
INTRAMUSCULAR | Status: AC
  Filled 2017-02-25: qty 1

## 2017-02-25 MED ORDER — FAMOTIDINE 20 MG PO TABS
20.0000 mg | ORAL_TABLET | Freq: Every day | ORAL | Status: DC
  Administered 2017-02-26 – 2017-02-27 (×2): 20 mg via ORAL
  Filled 2017-02-25 (×2): qty 1

## 2017-02-25 MED ORDER — ACETAMINOPHEN 650 MG RE SUPP: 650.0000 mg | RECTAL | Status: DC | PRN

## 2017-02-25 MED ORDER — PROPOFOL 10 MG/ML IV BOLUS
INTRAVENOUS | Status: AC
  Filled 2017-02-25: qty 40

## 2017-02-25 MED ORDER — OXYCODONE HCL 5 MG PO TABS
10.0000 mg | ORAL_TABLET | ORAL | Status: DC | PRN
  Administered 2017-02-25 – 2017-02-26 (×5): 10 mg via ORAL
  Filled 2017-02-25 (×6): qty 2

## 2017-02-25 MED ORDER — KETOROLAC TROMETHAMINE 30 MG/ML IJ SOLN
INTRAMUSCULAR | Status: AC
  Filled 2017-02-25: qty 1

## 2017-02-25 SURGICAL SUPPLY — 53 items
BAG ZIPLOCK 12X15 (MISCELLANEOUS) IMPLANT
BANDAGE ACE 4X5 VEL STRL LF (GAUZE/BANDAGES/DRESSINGS) ×3 IMPLANT
BANDAGE ACE 6X5 VEL STRL LF (GAUZE/BANDAGES/DRESSINGS) ×3 IMPLANT
BATTERY INSTRU NAVIGATION (MISCELLANEOUS) ×9 IMPLANT
BLADE SAW RECIPROCATING 77.5 (BLADE) ×3 IMPLANT
CAPT KNEE TRIATH TK-4 ×3 IMPLANT
CHLORAPREP W/TINT 26ML (MISCELLANEOUS) ×6 IMPLANT
COVER SURGICAL LIGHT HANDLE (MISCELLANEOUS) ×3 IMPLANT
CUFF TOURN SGL QUICK 34 (TOURNIQUET CUFF) ×2
CUFF TRNQT CYL 34X4X40X1 (TOURNIQUET CUFF) ×1 IMPLANT
DECANTER SPIKE VIAL GLASS SM (MISCELLANEOUS) ×6 IMPLANT
DERMABOND ADVANCED (GAUZE/BANDAGES/DRESSINGS) ×2
DERMABOND ADVANCED .7 DNX12 (GAUZE/BANDAGES/DRESSINGS) ×1 IMPLANT
DRAPE SHEET LG 3/4 BI-LAMINATE (DRAPES) ×6 IMPLANT
DRAPE U-SHAPE 47X51 STRL (DRAPES) ×3 IMPLANT
DRSG AQUACEL AG ADV 3.5X10 (GAUZE/BANDAGES/DRESSINGS) ×3 IMPLANT
DRSG TEGADERM 4X4.75 (GAUZE/BANDAGES/DRESSINGS) IMPLANT
ELECT BLADE TIP CTD 4 INCH (ELECTRODE) ×3 IMPLANT
ELECT REM PT RETURN 15FT ADLT (MISCELLANEOUS) ×3 IMPLANT
EVACUATOR 1/8 PVC DRAIN (DRAIN) IMPLANT
GAUZE SPONGE 4X4 12PLY STRL (GAUZE/BANDAGES/DRESSINGS) ×3 IMPLANT
GLOVE BIO SURGEON STRL SZ8.5 (GLOVE) ×6 IMPLANT
GLOVE BIOGEL PI IND STRL 8.5 (GLOVE) ×1 IMPLANT
GLOVE BIOGEL PI INDICATOR 8.5 (GLOVE) ×2
GOWN SPEC L3 XXLG W/TWL (GOWN DISPOSABLE) ×3 IMPLANT
HANDPIECE INTERPULSE COAX TIP (DISPOSABLE) ×2
HOOD PEEL AWAY FLYTE STAYCOOL (MISCELLANEOUS) ×6 IMPLANT
MARKER SKIN DUAL TIP RULER LAB (MISCELLANEOUS) ×3 IMPLANT
NEEDLE SPNL 18GX3.5 QUINCKE PK (NEEDLE) ×3 IMPLANT
PACK TOTAL KNEE CUSTOM (KITS) ×3 IMPLANT
PADDING CAST COTTON 6X4 STRL (CAST SUPPLIES) ×3 IMPLANT
POSITIONER SURGICAL ARM (MISCELLANEOUS) ×3 IMPLANT
SAW OSC TIP CART 19.5X105X1.3 (SAW) ×3 IMPLANT
SEALER BIPOLAR AQUA 6.0 (INSTRUMENTS) ×3 IMPLANT
SET HNDPC FAN SPRY TIP SCT (DISPOSABLE) ×1 IMPLANT
SET PAD KNEE POSITIONER (MISCELLANEOUS) ×3 IMPLANT
SPONGE DRAIN TRACH 4X4 STRL 2S (GAUZE/BANDAGES/DRESSINGS) IMPLANT
SPONGE LAP 18X18 X RAY DECT (DISPOSABLE) IMPLANT
SUCTION FRAZIER HANDLE 12FR (TUBING) ×2
SUCTION TUBE FRAZIER 12FR DISP (TUBING) ×1 IMPLANT
SUT MNCRL AB 3-0 PS2 18 (SUTURE) ×3 IMPLANT
SUT MON AB 2-0 CT1 36 (SUTURE) ×6 IMPLANT
SUT STRATAFIX PDO 1 14 VIOLET (SUTURE) ×2
SUT STRATFX PDO 1 14 VIOLET (SUTURE) ×1
SUT VIC AB 1 CT1 36 (SUTURE) ×9 IMPLANT
SUT VIC AB 2-0 CT1 27 (SUTURE) ×2
SUT VIC AB 2-0 CT1 TAPERPNT 27 (SUTURE) ×1 IMPLANT
SUTURE STRATFX PDO 1 14 VIOLET (SUTURE) ×1 IMPLANT
SYR 50ML LL SCALE MARK (SYRINGE) ×3 IMPLANT
TOWER CARTRIDGE SMART MIX (DISPOSABLE) IMPLANT
TRAY FOLEY W/METER SILVER 16FR (SET/KITS/TRAYS/PACK) IMPLANT
WRAP KNEE MAXI GEL POST OP (GAUZE/BANDAGES/DRESSINGS) ×3 IMPLANT
YANKAUER SUCT BULB TIP 10FT TU (MISCELLANEOUS) ×3 IMPLANT

## 2017-02-25 NOTE — Anesthesia Preprocedure Evaluation (Addendum)
Anesthesia Evaluation  Patient identified by MRN, date of birth, ID band Patient awake    Reviewed: Allergy & Precautions, NPO status , Patient's Chart, lab work & pertinent test results  History of Anesthesia Complications (+) PONV  Airway Mallampati: II  TM Distance: >3 FB Neck ROM: Full    Dental no notable dental hx.    Pulmonary neg pulmonary ROS,    Pulmonary exam normal breath sounds clear to auscultation       Cardiovascular hypertension, Pt. on home beta blockers and Pt. on medications + CAD, + Past MI and + Cardiac Stents  Normal cardiovascular exam Rhythm:Regular Rate:Normal     Neuro/Psych negative neurological ROS  negative psych ROS   GI/Hepatic negative GI ROS, Neg liver ROS,   Endo/Other  diabetes, Type 2, Oral Hypoglycemic Agents  Renal/GU negative Renal ROS  negative genitourinary   Musculoskeletal negative musculoskeletal ROS (+)   Abdominal   Peds negative pediatric ROS (+)  Hematology negative hematology ROS (+)   Anesthesia Other Findings   Reproductive/Obstetrics negative OB ROS                             Anesthesia Physical Anesthesia Plan  ASA: III  Anesthesia Plan: Spinal   Post-op Pain Management:  Regional for Post-op pain   Induction: Intravenous  PONV Risk Score and Plan: 4 or greater and Ondansetron and Treatment may vary due to age or medical condition  Airway Management Planned: Simple Face Mask  Additional Equipment:   Intra-op Plan:   Post-operative Plan:   Informed Consent: I have reviewed the patients History and Physical, chart, labs and discussed the procedure including the risks, benefits and alternatives for the proposed anesthesia with the patient or authorized representative who has indicated his/her understanding and acceptance.   Dental advisory given  Plan Discussed with: CRNA  Anesthesia Plan Comments:          Anesthesia Quick Evaluation

## 2017-02-25 NOTE — Discharge Instructions (Signed)
° °Dr. Dowell Hoon °Total Joint Specialist °[facility] °[address] °[place], [place] [address] °[phone] ° °TOTAL KNEE REPLACEMENT POSTOPERATIVE DIRECTIONS ° ° ° °Knee Rehabilitation, Guidelines Following Surgery  °Results after knee surgery are often greatly improved when you follow the exercise, range of motion and muscle strengthening exercises prescribed by your doctor. Safety measures are also important to protect the knee from further injury. Any time any of these exercises cause you to have increased pain or swelling in your knee joint, decrease the amount until you are comfortable again and slowly increase them. If you have problems or questions, call your caregiver or physical therapist for advice.  ° °WEIGHT BEARING °Weight bearing as tolerated with assist device (walker, cane, etc) as directed, use it as long as suggested by your surgeon or therapist, typically at least 4-6 weeks. ° °HOME CARE INSTRUCTIONS  °Remove items at home which could result in a fall. This includes throw rugs or furniture in walking pathways.  °Continue medications as instructed at time of discharge. °You may have some home medications which will be placed on hold until you complete the course of blood thinner medication.  °You may start showering once you are discharged home but do not submerge the incision under water. Just pat the incision dry and apply a dry gauze dressing on daily. °Walk with walker as instructed.  °You may resume a sexual relationship in one month or when given the OK by your doctor.  °· Use walker as long as suggested by your caregivers. °· Avoid periods of inactivity such as sitting longer than an hour when not asleep. This helps prevent blood clots.  °You may put full weight on your legs and walk as much as is comfortable.  °You may return to work once you are cleared by your doctor.  °Do not drive a car for 6 weeks or until released by you surgeon.  °· Do not drive  while taking narcotics.  °Wear the elastic stockings for three weeks following surgery during the day but you may remove then at night. °Make sure you keep all of your appointments after your operation with all of your doctors and caregivers. You should call the office at the above phone number and make an appointment for approximately two weeks after the date of your surgery. °Do not remove your surgical dressing. The dressing is waterproof; you may take showers in 3 days, but do not take tub baths or submerge the dressing. °Please pick up a stool softener and laxative for home use as long as you are requiring pain medications. °· ICE to the affected knee every three hours for 30 minutes at a time and then as needed for pain and swelling.  Continue to use ice on the knee for pain and swelling from surgery. You may notice swelling that will progress down to the foot and ankle.  This is normal after surgery.  Elevate the leg when you are not up walking on it.   °It is important for you to complete the blood thinner medication as prescribed by your doctor. °· Continue to use the breathing machine which will help keep your temperature down.  It is common for your temperature to cycle up and down following surgery, especially at night when you are not up moving around and exerting yourself.  The breathing machine keeps your lungs expanded and your temperature down. ° °RANGE OF MOTION AND STRENGTHENING EXERCISES  °Rehabilitation of the knee is important following   a knee injury or an operation. After just a few days of immobilization, the muscles of the thigh which control the knee become weakened and shrink (atrophy). Knee exercises are designed to build up the tone and strength of the thigh muscles and to improve knee motion. Often times heat used for twenty to thirty minutes before working out will loosen up your tissues and help with improving the range of motion but do not use heat for the first two weeks following  surgery. These exercises can be done on a training (exercise) mat, on the floor, on a table or on a bed. Use what ever works the best and is most comfortable for you Knee exercises include:  °Leg Lifts - While your knee is still immobilized in a splint or cast, you can do straight leg raises. Lift the leg to 60 degrees, hold for 3 sec, and slowly lower the leg. Repeat 10-20 times 2-3 times daily. Perform this exercise against resistance later as your knee gets better.  °Quad and Hamstring Sets - Tighten up the muscle on the front of the thigh (Quad) and hold for 5-10 sec. Repeat this 10-20 times hourly. Hamstring sets are done by pushing the foot backward against an object and holding for 5-10 sec. Repeat as with quad sets.  °A rehabilitation program following serious knee injuries can speed recovery and prevent re-injury in the future due to weakened muscles. Contact your doctor or a physical therapist for more information on knee rehabilitation.  ° °SKILLED REHAB INSTRUCTIONS: °If the patient is transferred to a skilled rehab facility following release from the hospital, a list of the current medications will be sent to the facility for the patient to continue.  When discharged from the skilled rehab facility, please have the facility set up the patient's Home Health Physical Therapy prior to being released. Also, the skilled facility will be responsible for providing the patient with their medications at time of release from the facility to include their pain medication, the muscle relaxants, and their blood thinner medication. If the patient is still at the rehab facility at time of the two week follow up appointment, the skilled rehab facility will also need to assist the patient in arranging follow up appointment in our office and any transportation needs. ° °MAKE SURE YOU:  °Understand these instructions.  °Will watch your condition.  °Will get help right away if you are not doing well or get worse.   ° ° °Pick up stool softner and laxative for home use following surgery while on pain medications. °Do NOT remove your dressing. You may shower.  °Do not take tub baths or submerge incision under water. °May shower starting three days after surgery. °Please use a clean towel to pat the incision dry following showers. °Continue to use ice for pain and swelling after surgery. °Do not use any lotions or creams on the incision until instructed by your surgeon. ° °

## 2017-02-25 NOTE — Transfer of Care (Signed)
Immediate Anesthesia Transfer of Care Note  Patient: Pamela Burns  Procedure(s) Performed: LEFT TOTAL KNEE ARTHROPLASTY WITH COMPUTER NAVIGATION (Left Knee)  Patient Location: PACU  Anesthesia Type:MAC and Spinal  Level of Consciousness: awake, alert  and oriented  Airway & Oxygen Therapy: Patient Spontanous Breathing and Patient connected to face mask oxygen  Post-op Assessment: Report given to RN and Post -op Vital signs reviewed and stable  Post vital signs: Reviewed and stable  Last Vitals:  Vitals:   02/25/17 1338 02/25/17 1339  BP:    Pulse: 63 68  Resp: 14   Temp:    SpO2: 100% 99%    Last Pain:  Vitals:   02/25/17 1036  TempSrc: Oral      Patients Stated Pain Goal: 4 (83/09/40 7680)  Complications: No apparent anesthesia complications

## 2017-02-25 NOTE — Anesthesia Postprocedure Evaluation (Addendum)
Anesthesia Post Note  Patient: Pamela Burns  Procedure(s) Performed: LEFT TOTAL KNEE ARTHROPLASTY WITH COMPUTER NAVIGATION (Left Knee)     Patient location during evaluation: PACU Anesthesia Type: Spinal Level of consciousness: awake and alert Pain management: pain level controlled Vital Signs Assessment: post-procedure vital signs reviewed and stable Respiratory status: spontaneous breathing, nonlabored ventilation, respiratory function stable and patient connected to nasal cannula oxygen Cardiovascular status: blood pressure returned to baseline and stable Postop Assessment: no apparent nausea or vomiting Anesthetic complications: no    Last Vitals:  Vitals:   02/25/17 1733 02/25/17 1748  BP: (!) 137/98 (!) 165/81  Pulse: 66 66  Resp: 20 20  Temp: 36.6 C 36.4 C  SpO2: 99% 100%    Last Pain:  Vitals:   02/25/17 1833  TempSrc:   PainSc: 7                  Montez Hageman

## 2017-02-25 NOTE — Interval H&P Note (Signed)
History and Physical Interval Note:  02/25/2017 1:27 PM  Pamela Burns  has presented today for surgery, with the diagnosis of Degenerative joint disease left knee  The various methods of treatment have been discussed with the patient and family. After consideration of risks, benefits and other options for treatment, the patient has consented to  Procedure(s) with comments: LEFT TOTAL KNEE ARTHROPLASTY WITH COMPUTER NAVIGATION (Left) - Needs RNFA as a surgical intervention .  The patient's history has been reviewed, patient examined, no change in status, stable for surgery.  I have reviewed the patient's chart and labs.  Questions were answered to the patient's satisfaction.     Hilton Cork Lasundra Hascall

## 2017-02-25 NOTE — Anesthesia Procedure Notes (Signed)
Anesthesia Regional Block: Adductor canal block   Pre-Anesthetic Checklist: ,, timeout performed, Correct Patient, Correct Site, Correct Laterality, Correct Procedure, Correct Position, site marked, Risks and benefits discussed,  Surgical consent,  Pre-op evaluation,  At surgeon's request and post-op pain management  Laterality: Left and Lower  Prep: Maximum Sterile Barrier Precautions used, chloraprep       Needles:  Injection technique: Single-shot  Needle Type: Echogenic Stimulator Needle     Needle Length: 10cm      Additional Needles:   Procedures:,,,, ultrasound used (permanent image in chart),,,,  Narrative:  Start time: 02/25/2017 1:31 PM End time: 02/25/2017 1:41 PM Injection made incrementally with aspirations every 5 mL.  Performed by: Personally  Anesthesiologist: Montez Hageman, MD  Additional Notes: Risks, benefits and alternative to block explained extensively.  Patient tolerated procedure well, without complications.

## 2017-02-25 NOTE — Progress Notes (Signed)
AssistedDr. Carignan with left, ultrasound guided, adductor canal block. Side rails up, monitors on throughout procedure. See vital signs in flow sheet. Tolerated Procedure well.  

## 2017-02-25 NOTE — Anesthesia Procedure Notes (Signed)
Spinal  Patient location during procedure: OR Start time: 02/25/2017 1:47 PM End time: 02/25/2017 1:51 PM Staffing Anesthesiologist: Montez Hageman, MD Resident/CRNA: Glory Buff, CRNA Performed: resident/CRNA  Preanesthetic Checklist Completed: patient identified, site marked, surgical consent, pre-op evaluation, timeout performed, IV checked, risks and benefits discussed and monitors and equipment checked Spinal Block Patient position: sitting Prep: ChloraPrep Patient monitoring: heart rate, continuous pulse ox and blood pressure Approach: midline Location: L3-4 Injection technique: single-shot Needle Needle type: Pencan  Needle gauge: 24 G Needle length: 9 cm Needle insertion depth: 5 cm Assessment Sensory level: T6 Additional Notes Kit expiration date checked and confirmed. - heme, - paraesthesia, + CSF pre and post injection, patient tolerated well.

## 2017-02-25 NOTE — Anesthesia Procedure Notes (Signed)
Date/Time: 02/25/2017 2:05 PM Performed by: Glory Buff, CRNA Oxygen Delivery Method: Simple face mask

## 2017-02-25 NOTE — Anesthesia Procedure Notes (Signed)
Date/Time: 02/25/2017 1:43 PM Performed by: Glory Buff, CRNA Oxygen Delivery Method: Nasal cannula

## 2017-02-25 NOTE — Op Note (Signed)
OPERATIVE REPORT   SURGEON: Rod Can, MD   ASSISTANT: Sherlean Foot, RNFA.  PREOPERATIVE DIAGNOSIS: Left knee arthritis.   POSTOPERATIVE DIAGNOSIS: Left knee arthritis.   PROCEDURE: Left total knee arthroplasty.   IMPLANTS: Stryker Triathlon CR femur, size 5. Stryker Tritanium tibia, size 4. X3 polyethelyene insert, size 9 mm, CR. 3 button asymmetric patella, size 32 mm.  ANESTHESIA:  Regional and Spinal  TOURNIQUET TIME: Not utilized.   ESTIMATED BLOOD LOSS:-300 mL    ANTIBIOTICS: 2 g Ancef.  DRAINS: None.  COMPLICATIONS: None   CONDITION: PACU - hemodynamically stable.   BRIEF CLINICAL NOTE: Pamela Burns is a 66 y.o. female with a long-standing history of Left knee arthritis. After failing conservative management, the patient was indicated for total knee arthroplasty. The risks, benefits, and alternatives to the procedure were explained, and the patient elected to proceed.  PROCEDURE IN DETAIL: Adductor canal block was obtained in the pre-op holding area. Once inside the operative room, spinal anesthesia was obtained, and a foley catheter was inserted. The patient was then positioned, a nonsterile tourniquet was placed, and the lower extremity was prepped and draped in the normal sterile surgical fashion. A time-out was called verifying side and site of surgery. The patient received IV antibiotics within 60 minutes of beginning the procedure. The tourniquet was not utilized.  An anterior approach to the knee was performed utilizing a midvastus arthrotomy. A medial release was performed and the patellar fat pad was excised. Stryker navigation was used to cut the distal femur perpendicular to the mechanical axis. A freehand patellar resection was performed, and the patella was sized an prepared with 3 lug holes.  Nagivation was used to make a neutral proximal tibia resection, taking 9 mm of bone from the less affected lateral side with 3 degrees of slope. The menisci  were excised. A spacer block was placed, and the alignment and balance in extension were confirmed.   The distal femur was sized using the 3-degree external rotation guide referencing the posterior femoral cortex. The appropriate 4-in-1 cutting block was pinned into place. Rotation was checked using Whiteside's line, the epicondylar axis, and then confirmed with a spacer block in flexion. The remaining femoral cuts were performed, taking care to protect the MCL.  The tibia was sized and the trial tray was pinned into place. The remaining trail components were inserted. The knee was stable to varus and valgus stress through a full range of motion. The patella tracked centrally, and the PCL was well balanced. The trial components were removed, and the proximal tibial surface was prepared. Final components were impacted into place. The knee was tested for a final time and found to be well balanced.  The wound was copiously irrigated with normal saline with pulse lavage. Marcaine solution was injected into the periarticular soft tissue. The wound was closed in layers using #1 Vicryl and Stratafix for the fascia, 2-0 Vicryl for the subcutaneous fat, 2-0 Monocryl for the deep dermal layer, 3-0 running Monocryl subcuticular Stitch, and Dermabond for the skin. Once the glue was fully dried, an Aquacell Ag and compressive dressing were applied. Tthe patient was transported to the recovery room in stable condition. Sponge, needle, and instrument counts were correct at the end of the case x2. The patient tolerated the procedure well and there were no known complications.

## 2017-02-26 ENCOUNTER — Encounter (HOSPITAL_COMMUNITY): Payer: Self-pay | Admitting: Orthopedic Surgery

## 2017-02-26 LAB — GLUCOSE, CAPILLARY
GLUCOSE-CAPILLARY: 145 mg/dL — AB (ref 65–99)
Glucose-Capillary: 163 mg/dL — ABNORMAL HIGH (ref 65–99)
Glucose-Capillary: 207 mg/dL — ABNORMAL HIGH (ref 65–99)
Glucose-Capillary: 202 mg/dL — ABNORMAL HIGH (ref 65–99)

## 2017-02-26 LAB — CBC
HCT: 25.9 % — ABNORMAL LOW (ref 36.0–46.0)
HEMOGLOBIN: 8.8 g/dL — AB (ref 12.0–15.0)
MCH: 28.7 pg (ref 26.0–34.0)
MCHC: 34 g/dL (ref 30.0–36.0)
MCV: 84.4 fL (ref 78.0–100.0)
PLATELETS: 225 10*3/uL (ref 150–400)
RBC: 3.07 MIL/uL — AB (ref 3.87–5.11)
RDW: 12.6 % (ref 11.5–15.5)
WBC: 12.6 10*3/uL — AB (ref 4.0–10.5)

## 2017-02-26 LAB — BASIC METABOLIC PANEL
ANION GAP: 6 (ref 5–15)
BUN: 17 mg/dL (ref 6–20)
CO2: 24 mmol/L (ref 22–32)
Calcium: 8.2 mg/dL — ABNORMAL LOW (ref 8.9–10.3)
Chloride: 104 mmol/L (ref 101–111)
Creatinine, Ser: 0.6 mg/dL (ref 0.44–1.00)
Glucose, Bld: 183 mg/dL — ABNORMAL HIGH (ref 65–99)
POTASSIUM: 3.9 mmol/L (ref 3.5–5.1)
SODIUM: 134 mmol/L — AB (ref 135–145)

## 2017-02-26 MED ORDER — HYDROMORPHONE HCL 2 MG PO TABS: 1.0000 mg | ORAL_TABLET | ORAL | 0 refills | Status: AC | PRN

## 2017-02-26 MED ORDER — DOCUSATE SODIUM 100 MG PO CAPS: 100.0000 mg | ORAL_CAPSULE | Freq: Two times a day (BID) | ORAL | 1 refills | Status: DC

## 2017-02-26 MED ORDER — HYDROMORPHONE HCL 2 MG PO TABS
1.0000 mg | ORAL_TABLET | ORAL | Status: DC | PRN
  Administered 2017-02-26 – 2017-02-27 (×4): 2 mg via ORAL
  Filled 2017-02-26 (×4): qty 1

## 2017-02-26 MED ORDER — ONDANSETRON HCL 4 MG PO TABS: 4.0000 mg | ORAL_TABLET | Freq: Four times a day (QID) | ORAL | 0 refills | Status: DC | PRN

## 2017-02-26 MED ORDER — ASPIRIN 81 MG PO CHEW: 81.0000 mg | CHEWABLE_TABLET | Freq: Two times a day (BID) | ORAL | 1 refills | Status: DC

## 2017-02-26 MED ORDER — SENNA 8.6 MG PO TABS: 2.0000 | ORAL_TABLET | Freq: Every day | ORAL | 0 refills | Status: DC

## 2017-02-26 NOTE — Progress Notes (Signed)
   Subjective:  Patient reports pain as mild to moderate.  C/o N/V especially when she takes pain m eds or eats. Denies CP/SOB.  Objective:   VITALS:   Vitals:   02/25/17 1937 02/26/17 0009 02/26/17 0531 02/26/17 0918  BP: 125/67 135/71 (!) 124/54 127/63  Pulse: 61 73 92 93  Resp: 18 17 16 16   Temp: 98.1 F (36.7 C) 98.1 F (36.7 C) 99.3 F (37.4 C) 98.5 F (36.9 C)  TempSrc: Oral Oral Oral Oral  SpO2: 93% 95% 95% 97%  Weight:      Height:        NAD ABD soft Sensation intact distally Intact pulses distally Dorsiflexion/Plantar flexion intact Incision: dressing C/D/I Compartment soft   Lab Results  Component Value Date   WBC 12.6 (H) 02/26/2017   HGB 8.8 (L) 02/26/2017   HCT 25.9 (L) 02/26/2017   MCV 84.4 02/26/2017   PLT 225 02/26/2017   BMET    Component Value Date/Time   NA 134 (L) 02/26/2017 0512   K 3.9 02/26/2017 0512   CL 104 02/26/2017 0512   CO2 24 02/26/2017 0512   GLUCOSE 183 (H) 02/26/2017 0512   BUN 17 02/26/2017 0512   CREATININE 0.60 02/26/2017 0512   CALCIUM 8.2 (L) 02/26/2017 0512   GFRNONAA >60 02/26/2017 0512   GFRAA >60 02/26/2017 0512     Assessment/Plan: 1 Day Post-Op   Principal Problem:   Osteoarthritis of left knee   WBAT with walker DVT ppx: ASA + plavix, SCDs, TEDs PO pain control N/V: will change from oxy to dilaudid, antiemetics, monitor PT/OT Dispo: slowly progress diet, D/C home with HHPT when tolerating POs & has BM   Hilton Cork Bryant Lipps 02/26/2017, 12:15 PM   Rod Can, MD Cell [phone]

## 2017-02-26 NOTE — Evaluation (Signed)
Occupational Therapy Evaluation Patient Details Name: Pamela Burns MRN: [id number] DOB: 20-Aug-1950 Today's Date: 02/26/2017    History of Present Illness 66 yo female s/p L TKA 02/25/17. Hx of DM, MI, CAD   Clinical Impression   Pt was admitted for the above sx. All education was completed. No further OT is needed at this time     Follow Up Recommendations  Supervision/Assistance - 24 hour    Equipment Recommendations  3 in 1 bedside commode    Recommendations for Other Services       Precautions / Restrictions Precautions Precautions: Fall Restrictions Weight Bearing Restrictions: No LLE Weight Bearing: Weight bearing as tolerated      Mobility Bed Mobility Overal bed mobility: Needs Assistance Bed Mobility: Supine to Sit     Supine to sit: Min assist;HOB elevated     General bed mobility comments: oob with PT  Transfers Overall transfer level: Needs assistance Equipment used: Rolling walker (2 wheeled) Transfers: Sit to/from [employer] Sit to Stand: Min guard Stand pivot transfers: Min assist       General transfer comment: for safety    Balance                                           ADL either performed or assessed with clinical judgement   ADL Overall ADL's : Needs assistance/impaired                         Toilet Transfer: Min guard;Ambulation;BSC;RW   Toileting- Water quality scientist and Hygiene: Min guard;Sit to/from stand         General ADL Comments: pt will have assistance with adls at home: not interested in AE.  She can perform UB adls with set up and mod A for LB adls.  Demonstrated shower transfer:  pt did not feel she needed to practice this prior to d/c.  Handout given     Vision         Perception     Praxis      Pertinent Vitals/Pain Pain Assessment: 0-10 Pain Score: 6  Pain Location: R lateral knee Pain Descriptors / Indicators: Aching;Sore Pain Intervention(s):  Monitored during session;Repositioned;Ice applied     Hand Dominance     Extremity/Trunk Assessment Upper Extremity Assessment Upper Extremity Assessment: Overall WFL for tasks assessed      Cervical / Trunk Assessment Cervical / Trunk Assessment: Normal   Communication Communication Communication: No difficulties   Cognition Arousal/Alertness: Awake/alert Behavior During Therapy: WFL for tasks assessed/performed Overall Cognitive Status: Within Functional Limits for tasks assessed                                     General Comments       Exercises Total Joint Exercises Ankle Circles/Pumps: AROM;Both;10 reps;Supine Quad Sets: AROM;Both;10 reps;Supine Heel Slides: AAROM;Left;10 reps;Supine(pt used sheet) Hip ABduction/ADduction: AROM;Left;10 reps;Supine;AAROM Straight Leg Raises: AAROM;AROM;10 reps;Left;Supine Goniometric ROM: ~5-70 degrees   Shoulder Instructions      Home Living Family/patient expects to be discharged to:: Private residence Living Arrangements: Spouse/significant other Available Help at Discharge: Family;Available 24 hours/day Type of Home: House Home Access: Stairs to enter [employer] of Steps: 1 Entrance Stairs-Rails: None Home Layout: Able to live on main level with bedroom/bathroom  Bathroom Shower/Tub: Occupational psychologist: Handicapped height     Home Equipment: Environmental consultant - 2 wheels;Bedside commode   Additional Comments: requesting 3:1 for next to bed at night      Prior Functioning/Environment Level of Independence: Independent                 OT Problem List:        OT Treatment/Interventions:      OT Goals(Current goals can be found in the care plan section) Acute Rehab OT Goals Patient Stated Goal: to regain independence and PLOF OT Goal Formulation: All assessment and education complete, DC therapy  OT Frequency:     Barriers to D/C:            Co-evaluation               AM-PAC PT "6 Clicks" Daily Activity     Outcome Measure Help from another person eating meals?: None Help from another person taking care of personal grooming?: A Little Help from another person toileting, which includes using toliet, bedpan, or urinal?: A Little Help from another person bathing (including washing, rinsing, drying)?: A Little Help from another person to put on and taking off regular upper body clothing?: A Little Help from another person to put on and taking off regular lower body clothing?: A Lot 6 Click Score: 18   End of Session    Activity Tolerance: Patient tolerated treatment well Patient left: in bed;with call bell/phone within reach  OT Visit Diagnosis: Pain Pain - Right/Left: Left Pain - part of body: Knee                Time: 0956-1033(RN came in several times) OT Time Calculation (min): 37 min Charges:  OT General Charges $OT Visit: 1 Visit OT Evaluation $OT Eval Low Complexity: 1 Low G-Codes:     [place], OTR/L [phone] 02/26/2017  [name] 02/26/2017, 11:28 AM

## 2017-02-26 NOTE — Discharge Summary (Signed)
Physician Discharge Summary  Patient ID: Pamela Burns MRN: [id number] DOB/AGE: 1951/01/21 66 y.o.  Admit date: 02/25/2017 Discharge date: 02/17/17  Admission Diagnoses:  Osteoarthritis of left knee  Discharge Diagnoses:  Principal Problem:   Osteoarthritis of left knee   Past Medical History:  Diagnosis Date  . Aortic insufficiency    trace  . Cancer [facility])    thyroid right side  . Coronary artery disease   . Diabetes mellitus without complication ([place])   . GERD (gastroesophageal reflux disease)   . Hypertension   . Hypertensive heart disease   . Hypothyroidism   . Mitral insufficiency    trace  . Moderate concentric left ventricular hypertrophy   . Myocardial infarction ([place]) 07/01/2002  . Osteoarthritis of left knee 02/02/2017  . Osteoarthritis of left knee   . Pneumonia    history of  . PONV (postoperative nausea and vomiting)   . Tricuspid insufficiency    trace    Surgeries: Procedure(s): LEFT TOTAL KNEE ARTHROPLASTY WITH COMPUTER NAVIGATION on 02/25/2017   Consultants (if any):   Discharged Condition: Improved  Hospital Course: Pamela Burns is an 66 y.o. female who was admitted 02/25/2017 with a diagnosis of Osteoarthritis of left knee and went to the operating room on 02/25/2017 and underwent the above named procedures.    She was given perioperative antibiotics:  Anti-infectives (From admission, onward)   Start     Dose/Rate Route Frequency Ordered Stop   02/25/17 2000  ceFAZolin (ANCEF) IVPB 1 g/50 mL premix     1 g 100 mL/hr over 30 Minutes Intravenous Every 6 hours 02/25/17 1804 02/26/17 0208   02/25/17 1006  ceFAZolin (ANCEF) IVPB 2g/100 mL premix     2 g 200 mL/hr over 30 Minutes Intravenous On call to O.R. 02/25/17 1007 02/25/17 1422    .  She was given sequential compression devices, early ambulation, and ASA + plavix for DVT prophylaxis.  She benefited maximally from the hospital stay and there were no complications.    Recent vital  signs:  Vitals:   02/26/17 0531 02/26/17 0918  BP: (!) 124/54 127/63  Pulse: 92 93  Resp: 16 16  Temp: 99.3 F (37.4 C) 98.5 F (36.9 C)  SpO2: 95% 97%    Recent laboratory studies:  Lab Results  Component Value Date   HGB 8.8 (L) 02/26/2017   HGB 11.2 (L) 02/17/2017   Lab Results  Component Value Date   WBC 12.6 (H) 02/26/2017   PLT 225 02/26/2017   No results found for: INR Lab Results  Component Value Date   NA 134 (L) 02/26/2017   K 3.9 02/26/2017   CL 104 02/26/2017   CO2 24 02/26/2017   BUN 17 02/26/2017   CREATININE 0.60 02/26/2017   GLUCOSE 183 (H) 02/26/2017    Discharge Medications:   Allergies as of 02/26/2017      Reactions   Shellfish Allergy Anaphylaxis, Swelling   Benadryl [diphenhydramine] Hives   Codeine Nausea Only   Lipitor [atorvastatin] Other (See Comments)   Chest pain   Lortab [hydrocodone-acetaminophen] Nausea And Vomiting   Zocor [simvastatin] Other (See Comments)   Chest pain      Medication List    TAKE these medications   acetaminophen 500 MG tablet Commonly known as:  TYLENOL Take 1,000 mg by mouth every 6 (six) hours as needed for moderate pain or headache.   aspirin 81 MG chewable tablet Chew 1 tablet (81 mg total) by mouth 2 (two) times daily.  clopidogrel 75 MG tablet Commonly known as:  PLAVIX Take 75 mg by mouth daily.   diclofenac sodium 1 % Gel Commonly known as:  VOLTAREN Apply 1 application topically 3 (three) times daily.   docusate sodium 100 MG capsule Commonly known as:  COLACE Take 1 capsule (100 mg total) by mouth 2 (two) times daily.   HYDROmorphone 2 MG tablet Commonly known as:  DILAUDID Take 0.5-1 tablets (1-2 mg total) by mouth every 4 (four) hours as needed for up to 7 days (PRN knee pain).   levothyroxine 100 MCG tablet Commonly known as:  SYNTHROID, LEVOTHROID Take 100 mcg by mouth daily before breakfast.   metFORMIN 500 MG tablet Commonly known as:  GLUCOPHAGE Take 500 mg by mouth  daily with breakfast.   metoprolol tartrate 50 MG tablet Commonly known as:  LOPRESSOR Take 50 mg by mouth daily.   NEXIUM 24HR 20 MG capsule Generic drug:  esomeprazole Take 40 mg by mouth 2 (two) times daily.   ondansetron 4 MG tablet Commonly known as:  ZOFRAN Take 1 tablet (4 mg total) by mouth every 6 (six) hours as needed for nausea.   ranitidine 150 MG tablet Commonly known as:  ZANTAC Take 300 mg by mouth daily at 12 noon.   rosuvastatin 5 MG tablet Commonly known as:  CRESTOR Take 5 mg by mouth daily.   senna 8.6 MG Tabs tablet Commonly known as:  SENOKOT Take 2 tablets (17.2 mg total) by mouth at bedtime.            Durable Medical Equipment  (From admission, onward)        Start     Ordered   02/25/17 1805  DME Walker rolling  Once    Question:  Patient needs a walker to treat with the following condition  Answer:  S/P total knee replacement, left   02/25/17 1804   02/25/17 1805  DME 3 n 1  Once     02/25/17 1804      Diagnostic Studies: Dg Knee Left Port  Result Date: 02/25/2017 CLINICAL DATA:  Knee arthroplasty EXAM: PORTABLE LEFT KNEE - 1-2 VIEW COMPARISON:  None. FINDINGS: Status post left knee replacement with normal alignment. Gas in the joint space and soft tissues. No fracture IMPRESSION: Status post left knee replacement with expected postsurgical changes Electronically Signed   By: Donavan Foil M.D.   On: 02/25/2017 17:44    Disposition: Final discharge disposition not confirmed    Follow-up Information    Swinteck, Aaron Edelman, MD. Schedule an appointment as soon as possible for a visit in 2 weeks.   Specialty:  Orthopedic Surgery Why:  For wound re-check Contact information: [address] [place] [address] [phone]            Signed: Hilton Cork Swinteck 02/26/2017, 12:31 PM

## 2017-02-26 NOTE — Progress Notes (Signed)
Discharge planning, spoke with patient at bedside. Have chosen [facility] for [facility] PT. Contacted [facility] for referral. Has RW and 3n1. [phone]

## 2017-02-26 NOTE — Progress Notes (Signed)
Physical Therapy Treatment Patient Details Name: Pamela Burns MRN: [id number] DOB: [birth year] Today's Date: 02/26/2017    History of Present Illness 66 yo female s/p L TKA 02/25/17. Hx of DM, MI, CAD    PT Comments    Progressing with mobility. Plan is for possible d/c home on tomorrow.  Follow Up Recommendations  DC plan and follow up therapy as arranged by surgeon     Equipment Recommendations  None recommended by PT    Recommendations for Other Services       Precautions / Restrictions Precautions Precautions: Fall Restrictions Weight Bearing Restrictions: No LLE Weight Bearing: Weight bearing as tolerated    Mobility  Bed Mobility Overal bed mobility: Needs Assistance Bed Mobility: Sit to Supine;Supine to Sit     Supine to sit: Supervision Sit to supine: Supervision   General bed mobility comments: for safety.   Transfers Overall transfer level: Needs assistance Equipment used: Rolling walker (2 wheeled) Transfers: Sit to/from Stand Sit to Stand: Min guard        General transfer comment: close guard for safety. VCS hand placement  Ambulation/Gait Ambulation/Gait assistance: Min guard Ambulation Distance (Feet): 50 Feet Assistive device: Rolling walker (2 wheeled) Gait Pattern/deviations: Step-to pattern;Antalgic     General Gait Details: close guard for safety. slow gait speed. VCs safety, sequence.    Stairs            Wheelchair Mobility    Modified Rankin (Stroke Patients Only)       Balance                                            Cognition Arousal/Alertness: Awake/alert Behavior During Therapy: WFL for tasks assessed/performed Overall Cognitive Status: Within Functional Limits for tasks assessed                                        Exercises    General Comments        Pertinent Vitals/Pain Pain Assessment: 0-10 Pain Score: 5  Pain Location: R lateral knee Pain Descriptors  / Indicators: Aching;Sore Pain Intervention(s): Monitored during session;Repositioned;Ice applied    Home Living Family/patient expects to be discharged to:: Private residence Living Arrangements: Spouse/significant other Available Help at Discharge: Family;Available 24 hours/day Type of Home: House Home Access: Stairs to enter Entrance Stairs-Rails: None Home Layout: Able to live on main level with bedroom/bathroom Home Equipment: Walker - 2 wheels;Bedside commode      Prior Function Level of Independence: Independent          PT Goals (current goals can now be found in the care plan section) Acute Rehab PT Goals Patient Stated Goal: to regain independence and PLOF PT Goal Formulation: With patient Time For Goal Achievement: 03/12/17 Potential to Achieve Goals: Good Progress towards PT goals: Progressing toward goals    Frequency    7X/week      PT Plan Current plan remains appropriate    Co-evaluation              AM-PAC PT "6 Clicks" Daily Activity  Outcome Measure  Difficulty turning over in bed (including adjusting bedclothes, sheets and blankets)?: A Little Difficulty moving from lying on back to sitting on the side of the bed? : A Little Difficulty sitting down on and  standing up from a chair with arms (e.g., wheelchair, bedside commode, etc,.)?: A Little Help needed moving to and from a bed to chair (including a wheelchair)?: A Little Help needed walking in hospital room?: A Little Help needed climbing 3-5 steps with a railing? : A Little 6 Click Score: 18    End of Session Equipment Utilized During Treatment: Gait belt Activity Tolerance: Patient tolerated treatment well Patient left: in bed;with call bell/phone within reach   PT Visit Diagnosis: Muscle weakness (generalized) (M62.81);Pain;Difficulty in walking, not elsewhere classified (R26.2) Pain - Right/Left: Left Pain - part of body: Knee     Time: 1342-1400 PT Time Calculation (min)  (ACUTE ONLY): 18 min  Charges:  $Gait Training: 8-22 mins                    G Codes:          Weston Anna, MPT Pager: [phone]

## 2017-02-26 NOTE — Evaluation (Signed)
Physical Therapy Evaluation Patient Details Name: Pamela Burns MRN: [id number] DOB: November 13, 1950 Today's Date: 02/26/2017   History of Present Illness  66 yo female s/p L TKA 02/25/17. Hx of DM, MI, CAD  Clinical Impression  On eval, pt required Min guard-Min assist with mobility. She walked ~50 feet with a RW. Pain rated 6/10 with activity. Will follow and progress activity as tolerated.     Follow Up Recommendations DC plan and follow up therapy as arranged by surgeon    Equipment Recommendations  None recommended by PT    Recommendations for Other Services       Precautions / Restrictions Precautions Precautions: Fall Restrictions Weight Bearing Restrictions: No LLE Weight Bearing: Weight bearing as tolerated      Mobility  Bed Mobility Overal bed mobility: Needs Assistance Bed Mobility: Supine to Sit     Supine to sit: Min assist;HOB elevated     General bed mobility comments: small amount of assist for R LE.   Transfers Overall transfer level: Needs assistance Equipment used: Rolling walker (2 wheeled) Transfers: Sit to/from [facility] Sit to Stand: Min assist Stand pivot transfers: Min assist       General transfer comment: Assist to rise, stabilize, control descent. VCs safety, technique, hand/LE placement. Stand pivot, bed to bsc, with RW. Pt tends to move quickly sometimes  Ambulation/Gait Ambulation/Gait assistance: Min assist Ambulation Distance (Feet): 50 Feet Assistive device: Rolling walker (2 wheeled) Gait Pattern/deviations: Step-to pattern;Antalgic     General Gait Details: Assist to steady intermittently. slow gait speed. VCs safety, sequence.   Stairs            Wheelchair Mobility    Modified Rankin (Stroke Patients Only)       Balance                                             Pertinent Vitals/Pain Pain Assessment: 0-10 Pain Score: 6  Pain Location: R lateral knee Pain Descriptors  / Indicators: Aching;Sore Pain Intervention(s): Monitored during session;Repositioned;Ice applied    Home Living Family/patient expects to be discharged to:: Private residence Living Arrangements: Spouse/significant other Available Help at Discharge: Family;Available 24 hours/day Type of Home: House Home Access: Stairs to enter Entrance Stairs-Rails: None Entrance Stairs-Number of Steps: 1 Home Layout: Able to live on main level with bedroom/bathroom Home Equipment: Walker - 2 wheels;Bedside commode Additional Comments: requesting 3:1 for next to bed at night    Prior Function Level of Independence: Independent               Hand Dominance        Extremity/Trunk Assessment   Upper Extremity Assessment Upper Extremity Assessment: Defer to OT evaluation    Lower Extremity Assessment Lower Extremity Assessment: Generalized weakness(s/p R TKA)    Cervical / Trunk Assessment Cervical / Trunk Assessment: Normal  Communication   Communication: No difficulties  Cognition Arousal/Alertness: Awake/alert Behavior During Therapy: WFL for tasks assessed/performed Overall Cognitive Status: Within Functional Limits for tasks assessed                                        General Comments      Exercises Total Joint Exercises Ankle Circles/Pumps: AROM;Both;10 reps;Supine Quad Sets: AROM;Both;10 reps;Supine Heel Slides: AAROM;Left;10 reps;Supine(pt used sheet)  Hip ABduction/ADduction: AROM;Left;10 reps;Supine;AAROM Straight Leg Raises: AAROM;AROM;10 reps;Left;Supine Goniometric ROM: ~5-70 degrees   Assessment/Plan    PT Assessment Patient needs continued PT services  PT Problem List Decreased strength;Decreased mobility;Decreased range of motion;Decreased activity tolerance;Decreased balance;Decreased knowledge of use of DME;Pain;Decreased knowledge of precautions       PT Treatment Interventions DME instruction;Gait training;Therapeutic  exercise;Therapeutic activities;Patient/family education;Balance training;Functional mobility training;Stair training    PT Goals (Current goals can be found in the Care Plan section)  Acute Rehab PT Goals Patient Stated Goal: to regain independence and PLOF PT Goal Formulation: With patient Time For Goal Achievement: 03/12/17 Potential to Achieve Goals: Good    Frequency 7X/week   Barriers to discharge        Co-evaluation               AM-PAC PT "6 Clicks" Daily Activity  Outcome Measure Difficulty turning over in bed (including adjusting bedclothes, sheets and blankets)?: A Little Difficulty moving from lying on back to sitting on the side of the bed? : A Little Difficulty sitting down on and standing up from a chair with arms (e.g., wheelchair, bedside commode, etc,.)?: A Little Help needed moving to and from a bed to chair (including a wheelchair)?: A Little Help needed walking in hospital room?: A Little Help needed climbing 3-5 steps with a railing? : A Little 6 Click Score: 18    End of Session Equipment Utilized During Treatment: Gait belt Activity Tolerance: Patient tolerated treatment well Patient left: in chair;with call bell/phone within reach   PT Visit Diagnosis: Muscle weakness (generalized) (M62.81);Pain;Difficulty in walking, not elsewhere classified (R26.2) Pain - Right/Left: Left Pain - part of body: Knee    Time: 3833-3832 PT Time Calculation (min) (ACUTE ONLY): 25 min   Charges:   PT Evaluation $PT Eval Low Complexity: 1 Low PT Treatments $Gait Training: 8-22 mins   PT G Codes:          Weston Anna, MPT Pager: [phone]

## 2017-02-27 LAB — CBC
HCT: 22.8 % — ABNORMAL LOW (ref 36.0–46.0)
Hemoglobin: 7.7 g/dL — ABNORMAL LOW (ref 12.0–15.0)
MCH: 28.8 pg (ref 26.0–34.0)
MCHC: 33.8 g/dL (ref 30.0–36.0)
MCV: 85.4 fL (ref 78.0–100.0)
PLATELETS: 205 10*3/uL (ref 150–400)
RBC: 2.67 MIL/uL — AB (ref 3.87–5.11)
RDW: 12.8 % (ref 11.5–15.5)
WBC: 10.4 10*3/uL (ref 4.0–10.5)

## 2017-02-27 LAB — GLUCOSE, CAPILLARY
GLUCOSE-CAPILLARY: 99 mg/dL (ref 65–99)
GLUCOSE-CAPILLARY: 111 mg/dL — AB (ref 65–99)

## 2017-02-27 NOTE — Progress Notes (Signed)
Physical Therapy Treatment Patient Details Name: Pamela Burns MRN: [id number] DOB: 1950/09/06 Today's Date: 02/27/2017    History of Present Illness 66 yo female s/p L TKA 02/25/17. Hx of DM, MI, CAD    PT Comments    Progressing with mobility. Pt c/o increased pain on today-rated 8/10 during session. Did not increase ambulation distance or exercise reps to avoid worsening pain level. Reviewed exercises and gait training. Issued HEP for pt to perform until she begins HHPT. Okay to d/c from PT standpoint-RN aware.    Follow Up Recommendations  DC plan and follow up therapy as arranged by surgeon     Equipment Recommendations  None recommended by PT    Recommendations for Other Services       Precautions / Restrictions Precautions Precautions: Fall Restrictions Weight Bearing Restrictions: No LLE Weight Bearing: Weight bearing as tolerated    Mobility  Bed Mobility Overal bed mobility: Needs Assistance Bed Mobility: Supine to Sit;Sit to Supine     Supine to sit: Min assist Sit to supine: Min assist   General bed mobility comments: Assist for R LE. Pt experiencing increased soreness today.  Transfers Overall transfer level: Needs assistance Equipment used: Rolling walker (2 wheeled) Transfers: Sit to/from Stand Sit to Stand: Min guard         General transfer comment: close guard for safety. VCS hand placement  Ambulation/Gait Ambulation/Gait assistance: Min guard Ambulation Distance (Feet): 50 Feet Assistive device: Rolling walker (2 wheeled) Gait Pattern/deviations: Step-to pattern;Antalgic     General Gait Details: close guard for safety. slow gait speed. VCs safety, sequence. did not increased ambulation distance this session due to increased pain on today.   Stairs            Wheelchair Mobility    Modified Rankin (Stroke Patients Only)       Balance                                            Cognition  Arousal/Alertness: Awake/alert Behavior During Therapy: WFL for tasks assessed/performed Overall Cognitive Status: Within Functional Limits for tasks assessed                                        Exercises Total Joint Exercises Ankle Circles/Pumps: AROM;Both;10 reps;Supine Quad Sets: AROM;Both;10 reps;Supine Heel Slides: AAROM;Left;10 reps;Supine(pt used sheet) Hip ABduction/ADduction: Left;10 reps;Supine;AAROM Straight Leg Raises: AAROM;10 reps;Left;Supine Goniometric ROM: ~5-60 degrees (limited by pain today)    General Comments        Pertinent Vitals/Pain Pain Assessment: 0-10 Pain Score: 8  Pain Location: R knee/thigh Pain Descriptors / Indicators: Aching;Sore Pain Intervention(s): Monitored during session;Repositioned;Ice applied    Home Living                      Prior Function            PT Goals (current goals can now be found in the care plan section) Progress towards PT goals: Progressing toward goals    Frequency    7X/week      PT Plan Current plan remains appropriate    Co-evaluation              AM-PAC PT "6 Clicks" Daily Activity  Outcome Measure  Difficulty turning over in  bed (including adjusting bedclothes, sheets and blankets)?: A Little Difficulty moving from lying on back to sitting on the side of the bed? : Unable Difficulty sitting down on and standing up from a chair with arms (e.g., wheelchair, bedside commode, etc,.)?: A Little Help needed moving to and from a bed to chair (including a wheelchair)?: A Little Help needed walking in hospital room?: A Little Help needed climbing 3-5 steps with a railing? : A Little 6 Click Score: 16    End of Session Equipment Utilized During Treatment: Gait belt Activity Tolerance: Patient tolerated treatment well Patient left: in bed;with call bell/phone within reach   PT Visit Diagnosis: Muscle weakness (generalized) (M62.81);Pain;Difficulty in walking, not  elsewhere classified (R26.2) Pain - Right/Left: Left Pain - part of body: Knee     Time: 0932-6712 PT Time Calculation (min) (ACUTE ONLY): 30 min  Charges:  $Gait Training: 8-22 mins $Therapeutic Exercise: 8-22 mins                    G Codes:          Weston Anna, MPT Pager: [phone]

## 2017-02-27 NOTE — Progress Notes (Signed)
Discharged from floor via w/c for transport home by car. Belongings & spouse with pt. No changes in assessment. Pamela Burns  

## 2017-02-27 NOTE — Progress Notes (Signed)
Subjective: 2 Days Post-Op Procedure(s) (LRB): LEFT TOTAL KNEE ARTHROPLASTY WITH COMPUTER NAVIGATION (Left) Patient reports pain as mild to knee.  Tolerating PO's. Progress with PT. Denies CP,SOB,or calf pain.  Objective: Vital signs in last 24 hours: Temp:  [98.5 F (36.9 C)-99.4 F (37.4 C)] 98.6 F (37 C) (12/29 0551) Pulse Rate:  [80-93] 83 (12/29 0551) Resp:  [16] 16 (12/29 0551) BP: (127-157)/(63-68) 157/65 (12/29 0551) SpO2:  [97 %-99 %] 97 % (12/29 0551)  Intake/Output from previous day: 12/28 0701 - 12/29 0700 In: 960 [P.O.:960] Out: 850 [Urine:850] Intake/Output this shift: No intake/output data recorded.  Recent Labs    02/26/17 0512 02/27/17 0559  HGB 8.8* 7.7*   Recent Labs    02/26/17 0512 02/27/17 0559  WBC 12.6* 10.4  RBC 3.07* 2.67*  HCT 25.9* 22.8*  PLT 225 205   Recent Labs    02/26/17 0512  NA 134*  K 3.9  CL 104  CO2 24  BUN 17  CREATININE 0.60  GLUCOSE 183*  CALCIUM 8.2*   No results for input(s): LABPT, INR in the last 72 hours.  Alert and oriented x3. RRR, Lungs clear, BS x4. Left Calf soft and non tender. L knee dressing C/D/I. No DVT signs. No signs of infection or compartment syndrome. LLE grossly neurovascularly intact.   Assessment/Plan: 2 Days Post-Op Procedure(s) (LRB): LEFT TOTAL KNEE ARTHROPLASTY WITH COMPUTER NAVIGATION (Left) D/c home Up with PT F/u in office in 2weeks Follow instructions Asymptotic with Post-op anemia  Marshell Rieger L 02/27/2017, 8:07 AM

## 2017-03-01 LAB — BPAM RBC
Blood Product Expiration Date: 201901142359
Blood Product Expiration Date: 201901142359
UNIT TYPE AND RH: 6200
UNIT TYPE AND RH: 6200

## 2017-03-01 LAB — TYPE AND SCREEN
ABO/RH(D): A POS
ANTIBODY SCREEN: NEGATIVE
UNIT DIVISION: 0
Unit division: 0

## 2018-01-30 DIAGNOSIS — Z8709 Personal history of other diseases of the respiratory system: Secondary | ICD-10-CM

## 2018-01-30 HISTORY — DX: Personal history of other diseases of the respiratory system: Z87.09

## 2018-03-02 ENCOUNTER — Ambulatory Visit: Payer: Self-pay | Admitting: Orthopedic Surgery

## 2018-03-04 ENCOUNTER — Ambulatory Visit: Payer: Self-pay | Admitting: Orthopedic Surgery

## 2018-03-04 NOTE — H&P (Signed)
TOTAL KNEE ADMISSION H&P  Patient is being admitted for right total knee arthroplasty.  Subjective:  Chief Complaint:right knee pain.  HPI: Pamela Burns, 68 y.o. female, has a history of pain and functional disability in the right knee due to arthritis and has failed non-surgical conservative treatments for greater than 12 weeks to includeNSAID's and/or analgesics, corticosteriod injections, viscosupplementation injections, flexibility and strengthening excercises, use of assistive devices, weight reduction as appropriate and activity modification.  Onset of symptoms was gradual, starting 5 years ago with gradually worsening course since that time. The patient noted no past surgery on the right knee(s).  Patient currently rates pain in the right knee(s) at 10 out of 10 with activity. Patient has night pain, worsening of pain with activity and weight bearing, pain that interferes with activities of daily living, pain with passive range of motion, crepitus and joint swelling.  Patient has evidence of subchondral cysts, subchondral sclerosis, periarticular osteophytes and joint space narrowing by imaging studies.  There is no active infection.  Patient Active Problem List   Diagnosis Date Noted  . Osteoarthritis of left knee 02/02/2017   Past Medical History:  Diagnosis Date  . Aortic insufficiency    trace  . 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 Coronary artery disease   . Diabetes mellitus without complication ([place])   . GERD (gastroesophageal reflux disease)   . Hypertension   . Hypertensive heart disease   . Hypothyroidism   . Mitral insufficiency    trace  . Moderate concentric left ventricular hypertrophy   . Myocardial infarction ([place]) 07/01/2002  . Osteoarthritis of left knee 02/02/2017  . Osteoarthritis of left knee   . Pneumonia    history of  . PONV (postoperative nausea and vomiting)   . Tricuspid insufficiency    trace    Past Surgical History:  Procedure Laterality  Date  . COLONOSCOPY    . HEEL SPUR SURGERY    . KNEE ARTHROPLASTY Left 02/25/2017   Procedure: LEFT TOTAL KNEE ARTHROPLASTY WITH COMPUTER NAVIGATION;  Surgeon: Rod Can, MD;  Location: [facility]S;  Service: Orthopedics;  Laterality: Left;  Needs RNFA  . right wrist surgery    . thumb fracture surgery    . THYROIDECTOMY, PARTIAL    . TONSILLECTOMY AND ADENOIDECTOMY    . TUMOR REMOVAL     lieomyoma  . UPPER GI ENDOSCOPY    . VAGINAL HYSTERECTOMY      Current Outpatient Medications  Medication Sig Dispense Refill Last Dose  . acetaminophen (TYLENOL) 500 MG tablet Take 1,000 mg by mouth every 6 (six) hours as needed for moderate pain or headache.   Past Week at Unknown time  . aspirin 81 MG chewable tablet Chew 1 tablet (81 mg total) by mouth 2 (two) times daily. 60 tablet 1   . clopidogrel (PLAVIX) 75 MG tablet Take 75 mg by mouth daily.   02/19/2017  . diclofenac sodium (VOLTAREN) 1 % GEL Apply 1 application topically 3 (three) times daily.   02/24/2017 at 0800  . docusate sodium (COLACE) 100 MG capsule Take 1 capsule (100 mg total) by mouth 2 (two) times daily. 60 capsule 1   . esomeprazole (NEXIUM 24HR) 20 MG capsule Take 40 mg by mouth 2 (two) times daily.   02/25/2017 at 0730  . levothyroxine (SYNTHROID, LEVOTHROID) 100 MCG tablet Take 100 mcg by mouth daily before breakfast.   02/25/2017 at 0730  . metFORMIN (GLUCOPHAGE) 500 MG tablet Take 500 mg by mouth  daily with breakfast.   02/24/2017 at 0800  . metoprolol tartrate (LOPRESSOR) 50 MG tablet Take 50 mg by mouth daily.   02/24/2017 at 0900  . ondansetron (ZOFRAN) 4 MG tablet Take 1 tablet (4 mg total) by mouth every 6 (six) hours as needed for nausea. 20 tablet 0   . ranitidine (ZANTAC) 150 MG tablet Take 300 mg by mouth daily at 12 noon.   Past Week at Unknown time  . rosuvastatin (CRESTOR) 5 MG tablet Take 5 mg by mouth daily.   02/24/2017 at 0800  . senna (SENOKOT) 8.6 MG TABS tablet Take 2 tablets (17.2 mg total) by mouth at  bedtime. 120 each 0    No current facility-administered medications for this visit.    Allergies  Allergen Reactions  . Shellfish Allergy Anaphylaxis and Swelling  . Benadryl [Diphenhydramine] Hives  . Codeine Nausea Only  . Lipitor [Atorvastatin] Other (See Comments)    Chest pain  . Lortab [Hydrocodone-Acetaminophen] Nausea And Vomiting  . Zocor [Simvastatin] Other (See Comments)    Chest pain    Social History   Tobacco Use  . Smoking status: Never Smoker  . Smokeless tobacco: Never Used  Substance Use Topics  . Alcohol use: No    Frequency: Never    No family history on file.   Review of Systems  Constitutional: Negative.   HENT: Negative.   Eyes: Negative.   Respiratory: Negative.   Cardiovascular: Negative.   Gastrointestinal: Negative.   Genitourinary: Negative.   Musculoskeletal: Positive for joint pain.  Skin: Negative.   Neurological: Negative.   Endo/Heme/Allergies: Negative.   Psychiatric/Behavioral: Negative.     Objective:  Physical Exam  Vitals reviewed. Constitutional: She is oriented to person, place, and time. She appears well-developed and well-nourished.  HENT:  Head: Normocephalic and atraumatic.  Eyes: Pupils are equal, round, and reactive to light. Conjunctivae and EOM are normal.  Neck: Normal range of motion. Neck supple.  Cardiovascular: Normal rate, regular rhythm and intact distal pulses.  Respiratory: Effort normal. No respiratory distress.  GI: Soft. She exhibits no distension.  Genitourinary:    Genitourinary Comments: deferred   Musculoskeletal:     Right knee: She exhibits decreased range of motion, swelling, effusion and abnormal alignment. Tenderness found. Medial joint line and lateral joint line tenderness noted.  Neurological: She is alert and oriented to person, place, and time. She has normal reflexes.  Skin: Skin is warm.  Psychiatric: She has a normal mood and affect. Her behavior is normal. Judgment and thought  content normal.    Vital signs in last 24 hours: @VSRANGES @  Labs:   Estimated body mass index is 26.63 kg/m as calculated from the following:   Height as of 02/25/17: 5\' 6"  (1.676 m).   Weight as of 02/25/17: 74.8 kg.   Imaging Review Plain radiographs demonstrate severe degenerative joint disease of the right knee(s). The overall alignment issignificant varus. The bone quality appears to be adequate for age and reported activity level.   Preoperative templating of the joint replacement has been completed, documented, and submitted to the Operating Room personnel in order to optimize intra-operative equipment management.    Patient's anticipated LOS is less than 2 midnights, meeting these requirements: - Younger than 93 - Lives within 1 hour of care - Has a competent adult at home to recover with post-op recover - NO history of  - Chronic pain requiring opiods  - Diabetes  - Coronary Artery Disease  - Heart failure  -  Heart attack  - Stroke  - DVT/VTE  - Cardiac arrhythmia  - Respiratory Failure/COPD  - Renal failure  - Anemia  - Advanced Liver disease        Assessment/Plan:  End stage arthritis, right knee   The patient history, physical examination, clinical judgment of the provider and imaging studies are consistent with end stage degenerative joint disease of the right knee(s) and total knee arthroplasty is deemed medically necessary. The treatment options including medical management, injection therapy arthroscopy and arthroplasty were discussed at length. The risks and benefits of total knee arthroplasty were presented and reviewed. The risks due to aseptic loosening, infection, stiffness, patella tracking problems, thromboembolic complications and other imponderables were discussed. The patient acknowledged the explanation, agreed to proceed with the plan and consent was signed. Patient is being admitted for inpatient treatment for surgery, pain control,  PT, OT, prophylactic antibiotics, VTE prophylaxis, progressive ambulation and ADL's and discharge planning. The patient is planning to be discharged home with OPPT

## 2018-03-04 NOTE — H&P (View-Only) (Signed)
TOTAL KNEE ADMISSION H&P  Patient is being admitted for right total knee arthroplasty.  Subjective:  Chief Complaint:right knee pain.  HPI: Pamela Burns, 68 y.o. female, has a history of pain and functional disability in the right knee due to arthritis and has failed non-surgical conservative treatments for greater than 12 weeks to includeNSAID's and/or analgesics, corticosteriod injections, viscosupplementation injections, flexibility and strengthening excercises, use of assistive devices, weight reduction as appropriate and activity modification.  Onset of symptoms was gradual, starting 5 years ago with gradually worsening course since that time. The patient noted no past surgery on the right knee(s).  Patient currently rates pain in the right knee(s) at 10 out of 10 with activity. Patient has night pain, worsening of pain with activity and weight bearing, pain that interferes with activities of daily living, pain with passive range of motion, crepitus and joint swelling.  Patient has evidence of subchondral cysts, subchondral sclerosis, periarticular osteophytes and joint space narrowing by imaging studies.  There is no active infection.  Patient Active Problem List   Diagnosis Date Noted  . Osteoarthritis of left knee 02/02/2017   Past Medical History:  Diagnosis Date  . Aortic insufficiency    trace  . Cancer [facility])    thyroid right side  . Coronary artery disease   . Diabetes mellitus without complication ([place])   . GERD (gastroesophageal reflux disease)   . Hypertension   . Hypertensive heart disease   . Hypothyroidism   . Mitral insufficiency    trace  . Moderate concentric left ventricular hypertrophy   . Myocardial infarction ([place]) 07/01/2002  . Osteoarthritis of left knee 02/02/2017  . Osteoarthritis of left knee   . Pneumonia    history of  . PONV (postoperative nausea and vomiting)   . Tricuspid insufficiency    trace    Past Surgical History:  Procedure Laterality  Date  . COLONOSCOPY    . HEEL SPUR SURGERY    . KNEE ARTHROPLASTY Left 02/25/2017   Procedure: LEFT TOTAL KNEE ARTHROPLASTY WITH COMPUTER NAVIGATION;  Surgeon: Rod Can, MD;  Location: [facility];  Service: Orthopedics;  Laterality: Left;  Needs RNFA  . right wrist surgery    . thumb fracture surgery    . THYROIDECTOMY, PARTIAL    . TONSILLECTOMY AND ADENOIDECTOMY    . TUMOR REMOVAL     lieomyoma  . UPPER GI ENDOSCOPY    . VAGINAL HYSTERECTOMY      Current Outpatient Medications  Medication Sig Dispense Refill Last Dose  . acetaminophen (TYLENOL) 500 MG tablet Take 1,000 mg by mouth every 6 (six) hours as needed for moderate pain or headache.   Past Week at Unknown time  . aspirin 81 MG chewable tablet Chew 1 tablet (81 mg total) by mouth 2 (two) times daily. 60 tablet 1   . clopidogrel (PLAVIX) 75 MG tablet Take 75 mg by mouth daily.   02/19/2017  . diclofenac sodium (VOLTAREN) 1 % GEL Apply 1 application topically 3 (three) times daily.   02/24/2017 at 0800  . docusate sodium (COLACE) 100 MG capsule Take 1 capsule (100 mg total) by mouth 2 (two) times daily. 60 capsule 1   . esomeprazole (NEXIUM 24HR) 20 MG capsule Take 40 mg by mouth 2 (two) times daily.   02/25/2017 at 0730  . levothyroxine (SYNTHROID, LEVOTHROID) 100 MCG tablet Take 100 mcg by mouth daily before breakfast.   02/25/2017 at 0730  . metFORMIN (GLUCOPHAGE) 500 MG tablet Take 500 mg by mouth  daily with breakfast.   02/24/2017 at 0800  . metoprolol tartrate (LOPRESSOR) 50 MG tablet Take 50 mg by mouth daily.   02/24/2017 at 0900  . ondansetron (ZOFRAN) 4 MG tablet Take 1 tablet (4 mg total) by mouth every 6 (six) hours as needed for nausea. 20 tablet 0   . ranitidine (ZANTAC) 150 MG tablet Take 300 mg by mouth daily at 12 noon.   Past Week at Unknown time  . rosuvastatin (CRESTOR) 5 MG tablet Take 5 mg by mouth daily.   02/24/2017 at 0800  . senna (SENOKOT) 8.6 MG TABS tablet Take 2 tablets (17.2 mg total) by mouth at  bedtime. 120 each 0    No current facility-administered medications for this visit.    Allergies  Allergen Reactions  . Shellfish Allergy Anaphylaxis and Swelling  . Benadryl [Diphenhydramine] Hives  . Codeine Nausea Only  . Lipitor [Atorvastatin] Other (See Comments)    Chest pain  . Lortab [Hydrocodone-Acetaminophen] Nausea And Vomiting  . Zocor [Simvastatin] Other (See Comments)    Chest pain    Social History   Tobacco Use  . Smoking status: Never Smoker  . Smokeless tobacco: Never Used  Substance Use Topics  . Alcohol use: No    Frequency: Never    No family history on file.   Review of Systems  Constitutional: Negative.   HENT: Negative.   Eyes: Negative.   Respiratory: Negative.   Cardiovascular: Negative.   Gastrointestinal: Negative.   Genitourinary: Negative.   Musculoskeletal: Positive for joint pain.  Skin: Negative.   Neurological: Negative.   Endo/Heme/Allergies: Negative.   Psychiatric/Behavioral: Negative.     Objective:  Physical Exam  Vitals reviewed. Constitutional: She is oriented to person, place, and time. She appears well-developed and well-nourished.  HENT:  Head: Normocephalic and atraumatic.  Eyes: Pupils are equal, round, and reactive to light. Conjunctivae and EOM are normal.  Neck: Normal range of motion. Neck supple.  Cardiovascular: Normal rate, regular rhythm and intact distal pulses.  Respiratory: Effort normal. No respiratory distress.  GI: Soft. She exhibits no distension.  Genitourinary:    Genitourinary Comments: deferred   Musculoskeletal:     Right knee: She exhibits decreased range of motion, swelling, effusion and abnormal alignment. Tenderness found. Medial joint line and lateral joint line tenderness noted.  Neurological: She is alert and oriented to person, place, and time. She has normal reflexes.  Skin: Skin is warm.  Psychiatric: She has a normal mood and affect. Her behavior is normal. Judgment and thought  content normal.    Vital signs in last 24 hours: @VSRANGES @  Labs:   Estimated body mass index is 26.63 kg/m as calculated from the following:   Height as of 02/25/17: 5\' 6"  (1.676 m).   Weight as of 02/25/17: 74.8 kg.   Imaging Review Plain radiographs demonstrate severe degenerative joint disease of the right knee(s). The overall alignment issignificant varus. The bone quality appears to be adequate for age and reported activity level.   Preoperative templating of the joint replacement has been completed, documented, and submitted to the Operating Room personnel in order to optimize intra-operative equipment management.    Patient's anticipated LOS is less than 2 midnights, meeting these requirements: - Younger than 45 - Lives within 1 hour of care - Has a competent adult at home to recover with post-op recover - NO history of  - Chronic pain requiring opiods  - Diabetes  - Coronary Artery Disease  - Heart failure  -  Heart attack  - Stroke  - DVT/VTE  - Cardiac arrhythmia  - Respiratory Failure/COPD  - Renal failure  - Anemia  - Advanced Liver disease        Assessment/Plan:  End stage arthritis, right knee   The patient history, physical examination, clinical judgment of the provider and imaging studies are consistent with end stage degenerative joint disease of the right knee(s) and total knee arthroplasty is deemed medically necessary. The treatment options including medical management, injection therapy arthroscopy and arthroplasty were discussed at length. The risks and benefits of total knee arthroplasty were presented and reviewed. The risks due to aseptic loosening, infection, stiffness, patella tracking problems, thromboembolic complications and other imponderables were discussed. The patient acknowledged the explanation, agreed to proceed with the plan and consent was signed. Patient is being admitted for inpatient treatment for surgery, pain control,  PT, OT, prophylactic antibiotics, VTE prophylaxis, progressive ambulation and ADL's and discharge planning. The patient is planning to be discharged home with OPPT

## 2018-03-14 NOTE — Patient Instructions (Signed)
Your procedure is scheduled on: Wednesday, Jan. 22, 2020   Surgery Time:  9:26AM-12:01 PM   Report to [place]  Entrance    Report to admitting at 6:45 AM   Call this number if you have problems the morning of surgery [phone]   Do not eat food or drink liquids :After Midnight.   Brush your teeth the morning of surgery.   Do NOT smoke after Midnight   Take these medicines the morning of surgery with A SIP OF WATER: Esomeprazole, Levothyroxine, Metoprolol, Rosuvastatin  DO NOT TAKE ANY DIABETIC MEDICATIONS DAY OF YOUR SURGERY                               You may not have any metal on your body including hair pins, jewelry, and body piercings             Do not wear make-up, lotions, powders, perfumes/cologne, or deodorant             Do not wear nail polish.  Do not shave  48 hours prior to surgery.                Do not bring valuables to the hospital. [place].   Contacts, dentures or bridgework may not be worn into surgery.   Leave suitcase in the car. After surgery it may be brought to your room.    Special Instructions: Bring a copy of your healthcare power of attorney and living will documents         the day of surgery if you haven't scanned them in before.              Please read over the following fact sheets you were given:  [facility] - Preparing for Surgery Before surgery, you can play an important role.  Because skin is not sterile, your skin needs to be as free of germs as possible.  You can reduce the number of germs on your skin by washing with CHG (chlorahexidine gluconate) soap before surgery.  CHG is an antiseptic cleaner which kills germs and bonds with the skin to continue killing germs even after washing. Please DO NOT use if you have an allergy to CHG or antibacterial soaps.  If your skin becomes reddened/irritated stop using the CHG and inform your nurse when you arrive at Short  Stay. Do not shave (including legs and underarms) for at least 48 hours prior to the first CHG shower.  You may shave your face/neck.  Please follow these instructions carefully:  1.  Shower with CHG Soap the night before surgery and the  morning of surgery.  2.  If you choose to wash your hair, wash your hair first as usual with your normal  shampoo.  3.  After you shampoo, rinse your hair and body thoroughly to remove the shampoo.                             4.  Use CHG as you would any other liquid soap.  You can apply chg directly to the skin and wash.  Gently with a scrungie or clean washcloth.  5.  Apply the CHG Soap to your body ONLY FROM THE NECK DOWN.   Do  not use on face/ open                           Wound or open sores. Avoid contact with eyes, ears mouth and   genitals (private parts).                       Wash face,  Genitals (private parts) with your normal soap.             6.  Wash thoroughly, paying special attention to the area where your    surgery  will be performed.  7.  Thoroughly rinse your body with warm water from the neck down.  8.  DO NOT shower/wash with your normal soap after using and rinsing off the CHG Soap.                9.  Pat yourself dry with a clean towel.            10.  Wear clean pajamas.            11.  Place clean sheets on your bed the night of your first shower and do not  sleep with pets. Day of Surgery : Do not apply any lotions/deodorants the morning of surgery.  Please wear clean clothes to the hospital/surgery center.  FAILURE TO FOLLOW THESE INSTRUCTIONS MAY RESULT IN THE CANCELLATION OF YOUR SURGERY  PATIENT SIGNATURE_________________________________  NURSE SIGNATURE__________________________________  ________________________________________________________________________   Pamela Burns  An incentive spirometer is a tool that can help keep your lungs clear and active. This tool measures how well you are filling your  lungs with each breath. Taking long deep breaths may help reverse or decrease the chance of developing breathing (pulmonary) problems (especially infection) following:  A long period of time when you are unable to move or be active. BEFORE THE PROCEDURE   If the spirometer includes an indicator to show your best effort, your nurse or respiratory therapist will set it to a desired goal.  If possible, sit up straight or lean slightly forward. Try not to slouch.  Hold the incentive spirometer in an upright position. INSTRUCTIONS FOR USE  1. Sit on the edge of your bed if possible, or sit up as far as you can in bed or on a chair. 2. Hold the incentive spirometer in an upright position. 3. Breathe out normally. 4. Place the mouthpiece in your mouth and seal your lips tightly around it. 5. Breathe in slowly and as deeply as possible, raising the piston or the ball toward the top of the column. 6. Hold your breath for 3-5 seconds or for as long as possible. Allow the piston or ball to fall to the bottom of the column. 7. Remove the mouthpiece from your mouth and breathe out normally. 8. Rest for a few seconds and repeat Steps 1 through 7 at least 10 times every 1-2 hours when you are awake. Take your time and take a few normal breaths between deep breaths. 9. The spirometer may include an indicator to show your best effort. Use the indicator as a goal to work toward during each repetition. 10. After each set of 10 deep breaths, practice coughing to be sure your lungs are clear. If you have an incision (the cut made at the time of surgery), support your incision when coughing by placing a pillow or rolled up towels firmly against it. Once you are able  to get out of bed, walk around indoors and cough well. You may stop using the incentive spirometer when instructed by your caregiver.  RISKS AND COMPLICATIONS  Take your time so you do not get dizzy or light-headed.  If you are in pain, you may need  to take or ask for pain medication before doing incentive spirometry. It is harder to take a deep breath if you are having pain. AFTER USE  Rest and breathe slowly and easily.  It can be helpful to keep track of a log of your progress. Your caregiver can provide you with a simple table to help with this. If you are using the spirometer at home, follow these instructions: [place] IF:   You are having difficultly using the spirometer.  You have trouble using the spirometer as often as instructed.  Your pain medication is not giving enough relief while using the spirometer.  You develop fever of 100.5 F (38.1 C) or higher. SEEK IMMEDIATE MEDICAL CARE IF:   You cough up bloody sputum that had not been present before.  You develop fever of 102 F (38.9 C) or greater.  You develop worsening pain at or near the incision site. MAKE SURE YOU:   Understand these instructions.  Will watch your condition.  Will get help right away if you are not doing well or get worse. Document Released: 06/29/2006 Document Revised: 05/11/2011 Document Reviewed: 08/30/2006 ExitCare Patient Information 2014 Exit[employer], [place].   ________________________________________________________________________  WHAT IS A BLOOD TRANSFUSION? Blood Transfusion Information  A transfusion is the replacement of blood or some of its parts. Blood is made up of multiple cells which provide different functions.  Red blood cells carry oxygen and are used for blood loss replacement.  White blood cells fight against infection.  Platelets control bleeding.  Plasma helps clot blood.  Other blood products are available for specialized needs, such as hemophilia or other clotting disorders. BEFORE THE TRANSFUSION  Who gives blood for transfusions?   Healthy volunteers who are fully evaluated to make sure their blood is safe. This is blood bank blood. Transfusion therapy is the safest it has ever been in the  practice of medicine. Before blood is taken from a donor, a complete history is taken to make sure that person has no history of diseases nor engages in risky social behavior (examples are intravenous drug use or sexual activity with multiple partners). The donor's travel history is screened to minimize risk of transmitting infections, such as malaria. The donated blood is tested for signs of infectious diseases, such as HIV and hepatitis. The blood is then tested to be sure it is compatible with you in order to minimize the chance of a transfusion reaction. If you or a relative donates blood, this is often done in anticipation of surgery and is not appropriate for emergency situations. It takes many days to process the donated blood. RISKS AND COMPLICATIONS Although transfusion therapy is very safe and saves many lives, the main dangers of transfusion include:   Getting an infectious disease.  Developing a transfusion reaction. This is an allergic reaction to something in the blood you were given. Every precaution is taken to prevent this. The decision to have a blood transfusion has been considered carefully by your caregiver before blood is given. Blood is not given unless the benefits outweigh the risks. AFTER THE TRANSFUSION  Right after receiving a blood transfusion, you will usually feel much better and more energetic. This is especially true  if your red blood cells have gotten low (anemic). The transfusion raises the level of the red blood cells which carry oxygen, and this usually causes an energy increase.  The nurse administering the transfusion will monitor you carefully for complications. HOME CARE INSTRUCTIONS  No special instructions are needed after a transfusion. You may find your energy is better. Speak with your caregiver about any limitations on activity for underlying diseases you may have. SEEK MEDICAL CARE IF:   Your condition is not improving after your transfusion.  You  develop redness or irritation at the intravenous (IV) site. SEEK IMMEDIATE MEDICAL CARE IF:  Any of the following symptoms occur over the next 12 hours:  Shaking chills.  You have a temperature by mouth above 102 F (38.9 C), not controlled by medicine.  Chest, back, or muscle pain.  People around you feel you are not acting correctly or are confused.  Shortness of breath or difficulty breathing.  Dizziness and fainting.  You get a rash or develop hives.  You have a decrease in urine output.  Your urine turns a dark color or changes to pink, red, or brown. Any of the following symptoms occur over the next 10 days:  You have a temperature by mouth above 102 F (38.9 C), not controlled by medicine.  Shortness of breath.  Weakness after normal activity.  The white part of the eye turns yellow (jaundice).  You have a decrease in the amount of urine or are urinating less often.  Your urine turns a dark color or changes to pink, red, or brown. Document Released: 02/14/2000 Document Revised: 05/11/2011 Document Reviewed: 10/03/2007 [facility] Patient Information 2014 [place], [place].  _______________________________________________________________________

## 2018-03-15 ENCOUNTER — Encounter (HOSPITAL_COMMUNITY)
Admission: RE | Admit: 2018-03-15 | Discharge: 2018-03-15 | Disposition: A | Discharge status: Home / Self Care | Payer: Medicare Other | Source: Ambulatory Visit | Attending: Orthopedic Surgery | Admitting: Orthopedic Surgery

## 2018-03-15 ENCOUNTER — Other Ambulatory Visit: Payer: Self-pay

## 2018-03-15 ENCOUNTER — Ambulatory Visit (HOSPITAL_COMMUNITY)
Admission: RE | Admit: 2018-03-15 | Discharge: 2018-03-15 | Disposition: A | Discharge status: Home / Self Care | Payer: Medicare Other | Source: Ambulatory Visit | Attending: Anesthesiology | Admitting: Anesthesiology

## 2018-03-15 ENCOUNTER — Encounter (HOSPITAL_COMMUNITY): Payer: Self-pay

## 2018-03-15 DIAGNOSIS — Z01818 Encounter for other preprocedural examination: Secondary | ICD-10-CM | POA: Diagnosis present

## 2018-03-15 HISTORY — DX: Personal history of other diseases of the respiratory system: Z87.09

## 2018-03-15 HISTORY — DX: Hyperlipidemia, unspecified: E78.5

## 2018-03-15 LAB — CBC
HCT: 36 % (ref 36.0–46.0)
Hemoglobin: 11.9 g/dL — ABNORMAL LOW (ref 12.0–15.0)
MCH: 29 pg (ref 26.0–34.0)
MCHC: 33.1 g/dL (ref 30.0–36.0)
MCV: 87.8 fL (ref 80.0–100.0)
Platelets: 263 10*3/uL (ref 150–400)
RBC: 4.1 MIL/uL (ref 3.87–5.11)
RDW: 12.4 % (ref 11.5–15.5)
WBC: 5.3 10*3/uL (ref 4.0–10.5)
nRBC: 0 % (ref 0.0–0.2)

## 2018-03-15 LAB — HEMOGLOBIN A1C
Hgb A1c MFr Bld: 6.5 % — ABNORMAL HIGH (ref 4.8–5.6)
Mean Plasma Glucose: 139.85 mg/dL

## 2018-03-15 LAB — BASIC METABOLIC PANEL
Anion gap: 8 (ref 5–15)
BUN: 9 mg/dL (ref 8–23)
CO2: 26 mmol/L (ref 22–32)
Calcium: 9.4 mg/dL (ref 8.9–10.3)
Chloride: 105 mmol/L (ref 98–111)
Creatinine, Ser: 0.5 mg/dL (ref 0.44–1.00)
GFR calc Af Amer: >60 mL/min (ref >60)
GFR calc non Af Amer: >60 mL/min (ref >60)
Glucose, Bld: 117 mg/dL — ABNORMAL HIGH (ref 70–99)
Potassium: 4.2 mmol/L (ref 3.5–5.1)
Sodium: 139 mmol/L (ref 135–145)

## 2018-03-15 LAB — SURGICAL PCR SCREEN
MRSA, PCR: NEGATIVE
STAPHYLOCOCCUS AUREUS: NEGATIVE

## 2018-03-15 LAB — GLUCOSE, CAPILLARY: Glucose-Capillary: 137 mg/dL — ABNORMAL HIGH (ref 70–99)

## 2018-03-15 NOTE — Pre-Procedure Instructions (Signed)
EKG 12/01/2017 in hard chart.

## 2018-03-15 NOTE — Pre-Procedure Instructions (Signed)
CBC, BMP, Hgb A1c results 03/15/2017 sent to Dr. Lyla Glassing via epic.  Chart sent to [facility].A. for review: Question if cardiac clearance [place] [address]) DM, HTN, takes Plavix

## 2018-03-17 NOTE — Progress Notes (Signed)
Anesthesia Chart Review   Case:  [id number] Date/Time:  03/23/18 0911   Procedure:  COMPUTER ASSISTED TOTAL KNEE ARTHROPLASTY (Right )   Anesthesia type:  Spinal   Pre-op diagnosis:  Degenerative joint disease right knee   Location:  [place] [facility]   Surgeon:  Rod Can, MD      DISCUSSION: 68 yo former smoker with h/o PONV, hypothyroidism (s/p thyroidectomy 01/2015 due to CA), CAD (s/p stent 06/2004), MI (2004), DM II, HTN, GERD scheduled for above surgery on 03/23/18 with Dr. Rod Can.   Pt was last seen by cardiology on 12/01/17, seen by Dr. Raechel Chute (note on chart), at this visit denied chest pain, shortness of breath, or other cardiovascular symptoms.  Dr. Darral Dash reports No ischemia noted on last testing.  DM II under good control as well as HTN.  Pt is currently on ASA and Plavix.  Per his note, "The patient continues to be low cardiac risk for her noncardiac surgery and should proceed with no additional testing." She is to hold Plavix 5 days prior, continue ASA.  Pt reports last dose of Plavix 03/18/18. Spoke with pt to confirm.   Hemoglobin 11.9 at PST visit on 03/15/2018.   Pt can proceed with planned procedure barring acute status change.  VS: BP (!) 143/76   Pulse 68   Temp 37.1 C (Oral)   Resp 16   Ht 5\' 6"  (1.676 m)   Wt 76.2 kg   SpO2 97%   BMI 27.12 kg/m   PROVIDERS: Michell Heinrich, MD is PCP last seen 12/14/17  Raechel Chute, MD is Cardiologist.  LABS: Labs reviewed: Acceptable for surgery. (all labs ordered are listed, but only abnormal results are displayed)  Labs Reviewed  HEMOGLOBIN A1C - Abnormal; Notable for the following components:      Result Value   Hgb A1c MFr Bld 6.5 (*)    All other components within normal limits  BASIC METABOLIC PANEL - Abnormal; Notable for the following components:   Glucose, Bld 117 (*)    All other components within normal limits  CBC - Abnormal; Notable for the following components:   Hemoglobin 11.9 (*)     All other components within normal limits  GLUCOSE, CAPILLARY - Abnormal; Notable for the following components:   Glucose-Capillary 137 (*)    All other components within normal limits  SURGICAL PCR SCREEN  TYPE AND SCREEN     IMAGES: Chest Xray 03/15/2018 FINDINGS: Cardiomediastinal silhouette unremarkable. Lungs clear. Bronchovascular markings normal. Pulmonary vascularity normal. No visible pleural effusions. No pneumothorax. Degenerative changes involving the thoracic and UPPER lumbar spine.  IMPRESSION: No acute cardiopulmonary disease.  EKG: 02/25/17 Rate 72 bpm Normal sinus rhythm Normal ECG  CV:  Past Medical History:  Diagnosis Date  . Aortic insufficiency    trace  . 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 Coronary artery disease   . Diabetes mellitus without complication ([place])   . GERD (gastroesophageal reflux disease)   . History of bronchitis 01/2018  . Hypertension   . Hypertensive heart disease   . Hypothyroidism   . Mitral insufficiency    trace  . Moderate concentric left ventricular hypertrophy   . Myocardial infarction ([place]) 07/01/2002  . Osteoarthritis of left knee 02/02/2017  . Osteoarthritis of left knee   . Pneumonia    history of  . PONV (postoperative nausea and vomiting)   . Tricuspid insufficiency    trace    Past  Surgical History:  Procedure Laterality Date  . COLONOSCOPY    . HEEL SPUR SURGERY    . KNEE ARTHROPLASTY Left 02/25/2017   Procedure: LEFT TOTAL KNEE ARTHROPLASTY WITH COMPUTER NAVIGATION;  Surgeon: Rod Can, MD;  Location: [facility];  Service: Orthopedics;  Laterality: Left;  Needs RNFA  . right wrist surgery    . thumb fracture surgery    . THYROIDECTOMY, PARTIAL    . TONSILLECTOMY AND ADENOIDECTOMY    . TUMOR REMOVAL     lieomyoma  . UPPER GI ENDOSCOPY    . VAGINAL HYSTERECTOMY      MEDICATIONS: . Calcium Carb-Cholecalciferol (CALCIUM 600/VITAMIN D3 PO)  . celecoxib (CELEBREX) 200 MG capsule  .  Cholecalciferol (VITAMIN D3 PO)  . clopidogrel (PLAVIX) 75 MG tablet  . diclofenac sodium (VOLTAREN) 1 % GEL  . esomeprazole (NEXIUM 24HR) 20 MG capsule  . levothyroxine (SYNTHROID, LEVOTHROID) 100 MCG tablet  . metFORMIN (GLUCOPHAGE) 500 MG tablet  . metoprolol tartrate (LOPRESSOR) 50 MG tablet  . Multiple Vitamins-Minerals (MULTIVITAMIN PO)  . Potassium 99 MG TABS  . rosuvastatin (CRESTOR) 5 MG tablet   No current facility-administered medications for this encounter.     Maia Plan [facility] Pre-Surgical Testing [phone] 03/21/18 1:49 PM

## 2018-03-18 ENCOUNTER — Encounter (HOSPITAL_COMMUNITY): Payer: Self-pay

## 2018-03-21 NOTE — Anesthesia Preprocedure Evaluation (Addendum)
Anesthesia Evaluation  Patient identified by MRN, date of birth, ID band Patient awake    Reviewed: Allergy & Precautions, NPO status , Patient's Chart, lab work & pertinent test results, reviewed documented beta blocker date and time   History of Anesthesia Complications (+) PONV and history of anesthetic complications  Airway Mallampati: II  TM Distance: >3 FB Neck ROM: Full    Dental no notable dental hx. (+) Teeth Intact, Dental Advisory Given   Pulmonary neg pulmonary ROS, former smoker,    Pulmonary exam normal breath sounds clear to auscultation       Cardiovascular hypertension, Pt. on home beta blockers and Pt. on medications + CAD, + Past MI (2004) and + Cardiac Stents (2006)  Normal cardiovascular exam Rhythm:Regular Rate:Normal     Neuro/Psych negative neurological ROS  negative psych ROS   GI/Hepatic Neg liver ROS, GERD  ,  Endo/Other  diabetes, Type 2, Oral Hypoglycemic AgentsHypothyroidism   Renal/GU negative Renal ROS  negative genitourinary   Musculoskeletal  (+) Arthritis , Osteoarthritis,    Abdominal   Peds  Hematology negative hematology ROS (+)   Anesthesia Other Findings On plavix, last dose 02/15/19 AM    Reproductive/Obstetrics                         Anesthesia Physical Anesthesia Plan  ASA: III  Anesthesia Plan: Regional and Spinal   Post-op Pain Management:    Induction: Intravenous  PONV Risk Score and Plan: 3 and Ondansetron, Dexamethasone and Treatment may vary due to age or medical condition  Airway Management Planned: Natural Airway  Additional Equipment:   Intra-op Plan:   Post-operative Plan:   Informed Consent: I have reviewed the patients History and Physical, chart, labs and discussed the procedure including the risks, benefits and alternatives for the proposed anesthesia with the patient or authorized representative who has indicated  his/her understanding and acceptance.     Dental advisory given  Plan Discussed with: CRNA  Anesthesia Plan Comments:       Anesthesia Quick Evaluation

## 2018-03-23 ENCOUNTER — Inpatient Hospital Stay (HOSPITAL_COMMUNITY): Payer: Medicare Other | Admitting: Physician Assistant

## 2018-03-23 ENCOUNTER — Encounter (HOSPITAL_COMMUNITY)
Admission: RE | Disposition: A | Discharge status: Home / Self Care | Payer: Self-pay | Source: Home / Self Care | Attending: Orthopedic Surgery

## 2018-03-23 ENCOUNTER — Inpatient Hospital Stay (HOSPITAL_COMMUNITY)
Admission: RE | Admit: 2018-03-23 | Discharge: 2018-03-25 | DRG: 470 | Disposition: A | Discharge status: Home / Self Care | Payer: Medicare Other | Attending: Orthopedic Surgery | Admitting: Orthopedic Surgery

## 2018-03-23 ENCOUNTER — Inpatient Hospital Stay (HOSPITAL_COMMUNITY): Payer: Medicare Other

## 2018-03-23 ENCOUNTER — Other Ambulatory Visit: Payer: Self-pay

## 2018-03-23 ENCOUNTER — Encounter (HOSPITAL_COMMUNITY): Payer: Self-pay | Admitting: General Practice

## 2018-03-23 ENCOUNTER — Inpatient Hospital Stay (HOSPITAL_COMMUNITY): Payer: Medicare Other | Admitting: Anesthesiology

## 2018-03-23 DIAGNOSIS — Z90710 Acquired absence of both cervix and uterus: Secondary | ICD-10-CM

## 2018-03-23 DIAGNOSIS — I119 Hypertensive heart disease without heart failure: Secondary | ICD-10-CM | POA: Diagnosis present

## 2018-03-23 DIAGNOSIS — Z79899 Other long term (current) drug therapy: Secondary | ICD-10-CM

## 2018-03-23 DIAGNOSIS — E119 Type 2 diabetes mellitus without complications: Secondary | ICD-10-CM | POA: Diagnosis present

## 2018-03-23 DIAGNOSIS — E785 Hyperlipidemia, unspecified: Secondary | ICD-10-CM | POA: Diagnosis present

## 2018-03-23 DIAGNOSIS — M25761 Osteophyte, right knee: Secondary | ICD-10-CM | POA: Diagnosis present

## 2018-03-23 DIAGNOSIS — D62 Acute posthemorrhagic anemia: Secondary | ICD-10-CM | POA: Diagnosis not present

## 2018-03-23 DIAGNOSIS — Z885 Allergy status to narcotic agent status: Secondary | ICD-10-CM | POA: Diagnosis not present

## 2018-03-23 DIAGNOSIS — Z79890 Hormone replacement therapy (postmenopausal): Secondary | ICD-10-CM | POA: Diagnosis not present

## 2018-03-23 DIAGNOSIS — E890 Postprocedural hypothyroidism: Secondary | ICD-10-CM | POA: Diagnosis present

## 2018-03-23 DIAGNOSIS — Z791 Long term (current) use of non-steroidal anti-inflammatories (NSAID): Secondary | ICD-10-CM

## 2018-03-23 DIAGNOSIS — Z9089 Acquired absence of other organs: Secondary | ICD-10-CM

## 2018-03-23 DIAGNOSIS — I083 Combined rheumatic disorders of mitral, aortic and tricuspid valves: Secondary | ICD-10-CM | POA: Diagnosis present

## 2018-03-23 DIAGNOSIS — K219 Gastro-esophageal reflux disease without esophagitis: Secondary | ICD-10-CM | POA: Diagnosis present

## 2018-03-23 DIAGNOSIS — Z7982 Long term (current) use of aspirin: Secondary | ICD-10-CM

## 2018-03-23 DIAGNOSIS — Z96651 Presence of right artificial knee joint: Secondary | ICD-10-CM

## 2018-03-23 DIAGNOSIS — Z96652 Presence of left artificial knee joint: Secondary | ICD-10-CM | POA: Diagnosis present

## 2018-03-23 DIAGNOSIS — M1711 Unilateral primary osteoarthritis, right knee: Secondary | ICD-10-CM | POA: Diagnosis present

## 2018-03-23 DIAGNOSIS — I252 Old myocardial infarction: Secondary | ICD-10-CM

## 2018-03-23 DIAGNOSIS — Z91013 Allergy to seafood: Secondary | ICD-10-CM

## 2018-03-23 DIAGNOSIS — Z85850 Personal history of malignant neoplasm of thyroid: Secondary | ICD-10-CM

## 2018-03-23 DIAGNOSIS — I2510 Atherosclerotic heart disease of native coronary artery without angina pectoris: Secondary | ICD-10-CM | POA: Diagnosis present

## 2018-03-23 DIAGNOSIS — Z888 Allergy status to other drugs, medicaments and biological substances status: Secondary | ICD-10-CM

## 2018-03-23 DIAGNOSIS — Z7984 Long term (current) use of oral hypoglycemic drugs: Secondary | ICD-10-CM

## 2018-03-23 DIAGNOSIS — Z7902 Long term (current) use of antithrombotics/antiplatelets: Secondary | ICD-10-CM

## 2018-03-23 HISTORY — PX: KNEE ARTHROPLASTY: SHX992

## 2018-03-23 LAB — TYPE AND SCREEN
ABO/RH(D): A POS
ANTIBODY SCREEN: NEGATIVE

## 2018-03-23 LAB — GLUCOSE, CAPILLARY
GLUCOSE-CAPILLARY: 196 mg/dL — AB (ref 70–99)
Glucose-Capillary: 121 mg/dL — ABNORMAL HIGH (ref 70–99)
Glucose-Capillary: 152 mg/dL — ABNORMAL HIGH (ref 70–99)
Glucose-Capillary: 200 mg/dL — ABNORMAL HIGH (ref 70–99)

## 2018-03-23 SURGERY — ARTHROPLASTY, KNEE, TOTAL, USING IMAGELESS COMPUTER-ASSISTED NAVIGATION
Anesthesia: Regional | Laterality: Right

## 2018-03-23 MED ORDER — PROPOFOL 10 MG/ML IV BOLUS
INTRAVENOUS | Status: DC | PRN
  Administered 2018-03-23 (×2): 20 mg via INTRAVENOUS

## 2018-03-23 MED ORDER — PROPOFOL 10 MG/ML IV BOLUS
INTRAVENOUS | Status: AC
  Filled 2018-03-23: qty 40

## 2018-03-23 MED ORDER — SODIUM CHLORIDE 0.9 % IR SOLN
Status: DC | PRN
  Administered 2018-03-23: 3000 mL

## 2018-03-23 MED ORDER — FENTANYL CITRATE (PF) 100 MCG/2ML IJ SOLN
INTRAMUSCULAR | Status: DC | PRN
  Administered 2018-03-23: 100 ug via INTRAVENOUS

## 2018-03-23 MED ORDER — ALBUMIN HUMAN 5 % IV SOLN
INTRAVENOUS | Status: DC | PRN
  Administered 2018-03-23: 12:00:00 via INTRAVENOUS

## 2018-03-23 MED ORDER — SODIUM CHLORIDE 0.9 % IV SOLN: INTRAVENOUS | Status: DC

## 2018-03-23 MED ORDER — ACETAMINOPHEN 10 MG/ML IV SOLN
1000.0000 mg | INTRAVENOUS | Status: AC
  Administered 2018-03-23: 1000 mg via INTRAVENOUS
  Filled 2018-03-23: qty 100

## 2018-03-23 MED ORDER — PROPOFOL 10 MG/ML IV BOLUS
INTRAVENOUS | Status: AC
  Filled 2018-03-23: qty 60

## 2018-03-23 MED ORDER — ONDANSETRON HCL 4 MG/2ML IJ SOLN
4.0000 mg | Freq: Four times a day (QID) | INTRAMUSCULAR | Status: DC | PRN
  Filled 2018-03-23: qty 2

## 2018-03-23 MED ORDER — LACTATED RINGERS IV SOLN
INTRAVENOUS | Status: DC
  Administered 2018-03-23 (×2): via INTRAVENOUS

## 2018-03-23 MED ORDER — MIDAZOLAM HCL 2 MG/2ML IJ SOLN
1.0000 mg | Freq: Once | INTRAMUSCULAR | Status: AC
  Administered 2018-03-23: 2 mg via INTRAVENOUS
  Filled 2018-03-23: qty 2

## 2018-03-23 MED ORDER — DEXAMETHASONE SODIUM PHOSPHATE 10 MG/ML IJ SOLN
INTRAMUSCULAR | Status: DC | PRN
  Administered 2018-03-23: 10 mg via INTRAVENOUS

## 2018-03-23 MED ORDER — METFORMIN HCL 500 MG PO TABS
500.0000 mg | ORAL_TABLET | Freq: Two times a day (BID) | ORAL | Status: DC
  Administered 2018-03-23 – 2018-03-25 (×4): 500 mg via ORAL
  Filled 2018-03-23 (×4): qty 1

## 2018-03-23 MED ORDER — CLOPIDOGREL BISULFATE 75 MG PO TABS
75.0000 mg | ORAL_TABLET | Freq: Every day | ORAL | Status: DC
  Administered 2018-03-23 – 2018-03-25 (×3): 75 mg via ORAL
  Filled 2018-03-23 (×3): qty 1

## 2018-03-23 MED ORDER — CEFAZOLIN SODIUM-DEXTROSE 1-4 GM/50ML-% IV SOLN
1.0000 g | Freq: Four times a day (QID) | INTRAVENOUS | Status: AC
  Administered 2018-03-23 (×2): 1 g via INTRAVENOUS
  Filled 2018-03-23 (×2): qty 50

## 2018-03-23 MED ORDER — FENTANYL CITRATE (PF) 100 MCG/2ML IJ SOLN
INTRAMUSCULAR | Status: AC
  Filled 2018-03-23: qty 2

## 2018-03-23 MED ORDER — POLYETHYLENE GLYCOL 3350 17 G PO PACK: 17.0000 g | PACK | Freq: Every day | ORAL | Status: DC | PRN

## 2018-03-23 MED ORDER — POVIDONE-IODINE 10 % EX SWAB: 2.0000 "application " | Freq: Once | CUTANEOUS | Status: DC

## 2018-03-23 MED ORDER — PHENOL 1.4 % MT LIQD: 1.0000 | OROMUCOSAL | Status: DC | PRN

## 2018-03-23 MED ORDER — BUPIVACAINE-EPINEPHRINE 0.25% -1:200000 IJ SOLN
INTRAMUSCULAR | Status: DC | PRN
  Administered 2018-03-23: 30 mL

## 2018-03-23 MED ORDER — LIDOCAINE HCL (CARDIAC) PF 100 MG/5ML IV SOSY
PREFILLED_SYRINGE | INTRAVENOUS | Status: DC | PRN
  Administered 2018-03-23: 80 mg via INTRAVENOUS

## 2018-03-23 MED ORDER — ONDANSETRON HCL 4 MG/2ML IJ SOLN
INTRAMUSCULAR | Status: DC | PRN
  Administered 2018-03-23: 4 mg via INTRAVENOUS

## 2018-03-23 MED ORDER — LIDOCAINE 2% (20 MG/ML) 5 ML SYRINGE
INTRAMUSCULAR | Status: AC
  Filled 2018-03-23: qty 5

## 2018-03-23 MED ORDER — METHOCARBAMOL 500 MG PO TABS
500.0000 mg | ORAL_TABLET | Freq: Four times a day (QID) | ORAL | Status: DC | PRN
  Administered 2018-03-23 – 2018-03-25 (×4): 500 mg via ORAL
  Filled 2018-03-23 (×4): qty 1

## 2018-03-23 MED ORDER — METHOCARBAMOL 500 MG IVPB - SIMPLE MED
500.0000 mg | Freq: Four times a day (QID) | INTRAVENOUS | Status: DC | PRN
  Administered 2018-03-23: 500 mg via INTRAVENOUS
  Filled 2018-03-23: qty 50

## 2018-03-23 MED ORDER — HYDROMORPHONE HCL 2 MG PO TABS
2.0000 mg | ORAL_TABLET | ORAL | Status: DC | PRN
  Administered 2018-03-23 – 2018-03-24 (×3): 2 mg via ORAL
  Administered 2018-03-24: 4 mg via ORAL
  Administered 2018-03-24 (×3): 2 mg via ORAL
  Administered 2018-03-25: 4 mg via ORAL
  Administered 2018-03-25: 2 mg via ORAL
  Filled 2018-03-23 (×3): qty 2
  Filled 2018-03-23 (×7): qty 1

## 2018-03-23 MED ORDER — BUPIVACAINE-EPINEPHRINE (PF) 0.25% -1:200000 IJ SOLN
INTRAMUSCULAR | Status: AC
  Filled 2018-03-23: qty 30

## 2018-03-23 MED ORDER — ISOPROPYL ALCOHOL 70 % SOLN
Status: DC | PRN
  Administered 2018-03-23: 1 via TOPICAL

## 2018-03-23 MED ORDER — METHOCARBAMOL 500 MG IVPB - SIMPLE MED
INTRAVENOUS | Status: AC
  Filled 2018-03-23: qty 50

## 2018-03-23 MED ORDER — SODIUM CHLORIDE (PF) 0.9 % IJ SOLN
INTRAMUSCULAR | Status: AC
  Filled 2018-03-23: qty 50

## 2018-03-23 MED ORDER — CEFAZOLIN SODIUM-DEXTROSE 2-4 GM/100ML-% IV SOLN
2.0000 g | INTRAVENOUS | Status: AC
  Administered 2018-03-23: 2 g via INTRAVENOUS
  Filled 2018-03-23: qty 100

## 2018-03-23 MED ORDER — MENTHOL 3 MG MT LOZG: 1.0000 | LOZENGE | OROMUCOSAL | Status: DC | PRN

## 2018-03-23 MED ORDER — INSULIN ASPART 100 UNIT/ML ~~LOC~~ SOLN: 0.0000 [IU] | Freq: Every day | SUBCUTANEOUS | Status: DC

## 2018-03-23 MED ORDER — CHLORHEXIDINE GLUCONATE 4 % EX LIQD: 60.0000 mL | Freq: Once | CUTANEOUS | Status: DC

## 2018-03-23 MED ORDER — 0.9 % SODIUM CHLORIDE (POUR BTL) OPTIME
TOPICAL | Status: DC | PRN
  Administered 2018-03-23: 1000 mL

## 2018-03-23 MED ORDER — ALUM & MAG HYDROXIDE-SIMETH 200-200-20 MG/5ML PO SUSP: 30.0000 mL | ORAL | Status: DC | PRN

## 2018-03-23 MED ORDER — ROSUVASTATIN CALCIUM 5 MG PO TABS
5.0000 mg | ORAL_TABLET | Freq: Every day | ORAL | Status: DC
  Administered 2018-03-24: 5 mg via ORAL
  Filled 2018-03-23: qty 1

## 2018-03-23 MED ORDER — CLONIDINE HCL (ANALGESIA) 100 MCG/ML EP SOLN
EPIDURAL | Status: DC | PRN
  Administered 2018-03-23: 50 ug

## 2018-03-23 MED ORDER — FENTANYL CITRATE (PF) 100 MCG/2ML IJ SOLN
50.0000 ug | Freq: Once | INTRAMUSCULAR | Status: DC
  Filled 2018-03-23: qty 2

## 2018-03-23 MED ORDER — ONDANSETRON HCL 4 MG PO TABS: 4.0000 mg | ORAL_TABLET | Freq: Four times a day (QID) | ORAL | Status: DC | PRN

## 2018-03-23 MED ORDER — SODIUM CHLORIDE 0.9 % IR SOLN
Status: DC | PRN
  Administered 2018-03-23: 1000 mL

## 2018-03-23 MED ORDER — KETOROLAC TROMETHAMINE 30 MG/ML IJ SOLN
INTRAMUSCULAR | Status: DC | PRN
  Administered 2018-03-23: 30 mg

## 2018-03-23 MED ORDER — EPHEDRINE 5 MG/ML INJ
INTRAVENOUS | Status: AC
  Filled 2018-03-23: qty 30

## 2018-03-23 MED ORDER — ASPIRIN 81 MG PO CHEW
81.0000 mg | CHEWABLE_TABLET | Freq: Two times a day (BID) | ORAL | Status: DC
  Administered 2018-03-24 – 2018-03-25 (×3): 81 mg via ORAL
  Filled 2018-03-23 (×4): qty 1

## 2018-03-23 MED ORDER — FENTANYL CITRATE (PF) 100 MCG/2ML IJ SOLN
25.0000 ug | INTRAMUSCULAR | Status: DC | PRN
  Administered 2018-03-23: 50 ug via INTRAVENOUS

## 2018-03-23 MED ORDER — METOCLOPRAMIDE HCL 5 MG/ML IJ SOLN: 5.0000 mg | Freq: Three times a day (TID) | INTRAMUSCULAR | Status: DC | PRN

## 2018-03-23 MED ORDER — BUPIVACAINE IN DEXTROSE 0.75-8.25 % IT SOLN
INTRATHECAL | Status: DC | PRN
  Administered 2018-03-23: 1.6 mL via INTRATHECAL

## 2018-03-23 MED ORDER — BUPIVACAINE HCL (PF) 0.5 % IJ SOLN
INTRAMUSCULAR | Status: DC | PRN
  Administered 2018-03-23: 20 mL

## 2018-03-23 MED ORDER — DOCUSATE SODIUM 100 MG PO CAPS
100.0000 mg | ORAL_CAPSULE | Freq: Two times a day (BID) | ORAL | Status: DC
  Administered 2018-03-23 – 2018-03-25 (×4): 100 mg via ORAL
  Filled 2018-03-23 (×4): qty 1

## 2018-03-23 MED ORDER — PHENYLEPHRINE 40 MCG/ML (10ML) SYRINGE FOR IV PUSH (FOR BLOOD PRESSURE SUPPORT)
PREFILLED_SYRINGE | INTRAVENOUS | Status: DC | PRN
  Administered 2018-03-23 (×3): 80 ug via INTRAVENOUS

## 2018-03-23 MED ORDER — KETOROLAC TROMETHAMINE 30 MG/ML IJ SOLN
INTRAMUSCULAR | Status: AC
  Filled 2018-03-23: qty 1

## 2018-03-23 MED ORDER — LEVOTHYROXINE SODIUM 100 MCG PO TABS
100.0000 ug | ORAL_TABLET | Freq: Every day | ORAL | Status: DC
  Administered 2018-03-24 – 2018-03-25 (×2): 100 ug via ORAL
  Filled 2018-03-23 (×2): qty 1

## 2018-03-23 MED ORDER — PANTOPRAZOLE SODIUM 40 MG PO TBEC
40.0000 mg | DELAYED_RELEASE_TABLET | Freq: Every day | ORAL | Status: DC
  Administered 2018-03-24 – 2018-03-25 (×2): 40 mg via ORAL
  Filled 2018-03-23 (×2): qty 1

## 2018-03-23 MED ORDER — METOCLOPRAMIDE HCL 5 MG PO TABS: 5.0000 mg | ORAL_TABLET | Freq: Three times a day (TID) | ORAL | Status: DC | PRN

## 2018-03-23 MED ORDER — STERILE WATER FOR IRRIGATION IR SOLN
Status: DC | PRN
  Administered 2018-03-23: 2000 mL

## 2018-03-23 MED ORDER — PROPOFOL 500 MG/50ML IV EMUL
INTRAVENOUS | Status: DC | PRN
  Administered 2018-03-23: 75 ug/kg/min via INTRAVENOUS

## 2018-03-23 MED ORDER — SODIUM CHLORIDE 0.9 % IV SOLN
INTRAVENOUS | Status: DC
  Administered 2018-03-23 – 2018-03-24 (×3): via INTRAVENOUS

## 2018-03-23 MED ORDER — DEXAMETHASONE SODIUM PHOSPHATE 10 MG/ML IJ SOLN
10.0000 mg | Freq: Once | INTRAMUSCULAR | Status: DC
  Filled 2018-03-23: qty 1

## 2018-03-23 MED ORDER — HYDROMORPHONE HCL 1 MG/ML IJ SOLN: 0.5000 mg | INTRAMUSCULAR | Status: DC | PRN

## 2018-03-23 MED ORDER — SODIUM CHLORIDE 0.9% FLUSH
INTRAVENOUS | Status: DC | PRN
  Administered 2018-03-23: 30 mL

## 2018-03-23 MED ORDER — EPHEDRINE SULFATE-NACL 50-0.9 MG/10ML-% IV SOSY
PREFILLED_SYRINGE | INTRAVENOUS | Status: DC | PRN
  Administered 2018-03-23 (×2): 10 mg via INTRAVENOUS

## 2018-03-23 MED ORDER — INSULIN ASPART 100 UNIT/ML ~~LOC~~ SOLN
0.0000 [IU] | Freq: Three times a day (TID) | SUBCUTANEOUS | Status: DC
  Administered 2018-03-23: 3 [IU] via SUBCUTANEOUS
  Administered 2018-03-24 (×2): 2 [IU] via SUBCUTANEOUS
  Administered 2018-03-25: 3 [IU] via SUBCUTANEOUS

## 2018-03-23 MED ORDER — TRANEXAMIC ACID-NACL 1000-0.7 MG/100ML-% IV SOLN
1000.0000 mg | INTRAVENOUS | Status: AC
  Administered 2018-03-23: 1000 mg via INTRAVENOUS
  Filled 2018-03-23: qty 100

## 2018-03-23 MED ORDER — SENNA 8.6 MG PO TABS
1.0000 | ORAL_TABLET | Freq: Two times a day (BID) | ORAL | Status: DC
  Administered 2018-03-23 – 2018-03-24 (×3): 8.6 mg via ORAL
  Filled 2018-03-23 (×4): qty 1

## 2018-03-23 MED ORDER — METOPROLOL TARTRATE 50 MG PO TABS
50.0000 mg | ORAL_TABLET | Freq: Every day | ORAL | Status: DC
  Administered 2018-03-24 – 2018-03-25 (×2): 50 mg via ORAL
  Filled 2018-03-23 (×2): qty 1

## 2018-03-23 SURGICAL SUPPLY — 67 items
BAG ZIPLOCK 12X15 (MISCELLANEOUS) IMPLANT
BANDAGE ACE 4X5 VEL STRL LF (GAUZE/BANDAGES/DRESSINGS) ×3 IMPLANT
BANDAGE ACE 6X5 VEL STRL LF (GAUZE/BANDAGES/DRESSINGS) ×3 IMPLANT
BANDAGE ELASTIC 4 VELCRO ST LF (GAUZE/BANDAGES/DRESSINGS) ×3 IMPLANT
BATTERY INSTRU NAVIGATION (MISCELLANEOUS) ×9 IMPLANT
BEARING TIBIA INSRT KNEE SZ4 9 (Insert) ×1 IMPLANT
BLADE SAW RECIPROCATING 77.5 (BLADE) ×3 IMPLANT
BNDG CONFORM 6X.82 1P STRL (GAUZE/BANDAGES/DRESSINGS) ×3 IMPLANT
BNDG ELASTIC 6X10 VLCR STRL LF (GAUZE/BANDAGES/DRESSINGS) ×3 IMPLANT
CHLORAPREP W/TINT 26ML (MISCELLANEOUS) ×6 IMPLANT
COVER SURGICAL LIGHT HANDLE (MISCELLANEOUS) ×3 IMPLANT
COVER WAND RF STERILE (DRAPES) IMPLANT
CUFF TOURN SGL QUICK 34 (TOURNIQUET CUFF) ×2
CUFF TRNQT CYL 34X4X40X1 (TOURNIQUET CUFF) ×1 IMPLANT
DECANTER SPIKE VIAL GLASS SM (MISCELLANEOUS) ×6 IMPLANT
DERMABOND ADVANCED (GAUZE/BANDAGES/DRESSINGS) ×2
DERMABOND ADVANCED .7 DNX12 (GAUZE/BANDAGES/DRESSINGS) ×1 IMPLANT
DRAPE SHEET LG 3/4 BI-LAMINATE (DRAPES) ×9 IMPLANT
DRAPE U-SHAPE 47X51 STRL (DRAPES) ×3 IMPLANT
DRSG AQUACEL AG ADV 3.5X10 (GAUZE/BANDAGES/DRESSINGS) ×3 IMPLANT
DRSG TEGADERM 4X4.75 (GAUZE/BANDAGES/DRESSINGS) IMPLANT
ELECT BLADE TIP CTD 4 INCH (ELECTRODE) ×3 IMPLANT
ELECT REM PT RETURN 15FT ADLT (MISCELLANEOUS) ×3 IMPLANT
EVACUATOR 1/8 PVC DRAIN (DRAIN) IMPLANT
GAUZE SPONGE 4X4 12PLY STRL (GAUZE/BANDAGES/DRESSINGS) ×3 IMPLANT
GLOVE BIO SURGEON STRL SZ8.5 (GLOVE) ×6 IMPLANT
GLOVE BIOGEL PI IND STRL 8.5 (GLOVE) ×1 IMPLANT
GLOVE BIOGEL PI INDICATOR 8.5 (GLOVE) ×2
GOWN SPEC L3 XXLG W/TWL (GOWN DISPOSABLE) ×3 IMPLANT
HANDPIECE INTERPULSE COAX TIP (DISPOSABLE) ×2
HOLDER FOLEY CATH W/STRAP (MISCELLANEOUS) ×3 IMPLANT
HOOD PEEL AWAY FLYTE STAYCOOL (MISCELLANEOUS) ×9 IMPLANT
KNEE FEMORAL COMP RT RETAIN (Knees) ×3 IMPLANT
KNEE PATELLA ASYMMETRIC 10X32 (Knees) ×3 IMPLANT
KNEE TIBIAL COMP TRI SZ4 (Knees) ×3 IMPLANT
MARKER SKIN DUAL TIP RULER LAB (MISCELLANEOUS) ×3 IMPLANT
NDL SAFETY ECLIPSE 18X1.5 (NEEDLE) ×1 IMPLANT
NEEDLE HYPO 18GX1.5 SHARP (NEEDLE) ×2
NEEDLE SPNL 18GX3.5 QUINCKE PK (NEEDLE) ×3 IMPLANT
NS IRRIG 1000ML POUR BTL (IV SOLUTION) ×3 IMPLANT
PACK TOTAL KNEE CUSTOM (KITS) ×3 IMPLANT
PADDING CAST COTTON 6X4 STRL (CAST SUPPLIES) ×3 IMPLANT
PROTECTOR NERVE ULNAR (MISCELLANEOUS) ×3 IMPLANT
SAW OSC TIP CART 19.5X105X1.3 (SAW) ×3 IMPLANT
SEALER BIPOLAR AQUA 6.0 (INSTRUMENTS) ×3 IMPLANT
SET HNDPC FAN SPRY TIP SCT (DISPOSABLE) ×1 IMPLANT
SET PAD KNEE POSITIONER (MISCELLANEOUS) ×3 IMPLANT
SPONGE DRAIN TRACH 4X4 STRL 2S (GAUZE/BANDAGES/DRESSINGS) IMPLANT
SPONGE LAP 18X18 RF (DISPOSABLE) IMPLANT
SUT MNCRL AB 3-0 PS2 18 (SUTURE) ×3 IMPLANT
SUT MNCRL AB 4-0 PS2 18 (SUTURE) ×3 IMPLANT
SUT MON AB 2-0 CT1 36 (SUTURE) ×6 IMPLANT
SUT STRATAFIX PDO 1 14 VIOLET (SUTURE) ×2
SUT STRATFX PDO 1 14 VIOLET (SUTURE) ×1
SUT VIC AB 1 CTX 36 (SUTURE) ×4
SUT VIC AB 1 CTX36XBRD ANBCTR (SUTURE) ×2 IMPLANT
SUT VIC AB 2-0 CT1 27 (SUTURE) ×2
SUT VIC AB 2-0 CT1 TAPERPNT 27 (SUTURE) ×1 IMPLANT
SUTURE STRATFX PDO 1 14 VIOLET (SUTURE) ×1 IMPLANT
SYR 3ML LL SCALE MARK (SYRINGE) ×3 IMPLANT
SYR 50ML LL SCALE MARK (SYRINGE) ×3 IMPLANT
TIBIA BEAR INSERT KNEE SZ4 9 (Insert) ×3 IMPLANT
TOWER CARTRIDGE SMART MIX (DISPOSABLE) IMPLANT
TRAY FOLEY MTR SLVR 16FR STAT (SET/KITS/TRAYS/PACK) IMPLANT
WATER STERILE IRR 1000ML POUR (IV SOLUTION) ×6 IMPLANT
WRAP KNEE MAXI GEL POST OP (GAUZE/BANDAGES/DRESSINGS) ×3 IMPLANT
YANKAUER SUCT BULB TIP 10FT TU (MISCELLANEOUS) ×3 IMPLANT

## 2018-03-23 NOTE — Anesthesia Procedure Notes (Signed)
Anesthesia Regional Block: Adductor canal block   Pre-Anesthetic Checklist: ,, timeout performed, Correct Patient, Correct Site, Correct Laterality, Correct Procedure, Correct Position, site marked, Risks and benefits discussed,  Surgical consent,  Pre-op evaluation,  At surgeon's request and post-op pain management  Laterality: Right  Prep: Maximum Sterile Barrier Precautions used, chloraprep       Needles:  Injection technique: Single-shot  Needle Type: Echogenic Stimulator Needle     Needle Length: 9cm  Needle Gauge: 22     Additional Needles:   Procedures:,,,, ultrasound used (permanent image in chart),,,,  Narrative:  Start time: 03/23/2018 9:20 AM End time: 03/23/2018 9:30 AM Injection made incrementally with aspirations every 5 mL.  Performed by: Personally  Anesthesiologist: Freddrick March, MD  Additional Notes: Monitors applied. No increased pain on injection. No increased resistance to injection. Injection made in 5cc increments. Good needle visualization. Patient tolerated procedure well.

## 2018-03-23 NOTE — Transfer of Care (Signed)
Immediate Anesthesia Transfer of Care Note  Patient: Pamela Burns  Procedure(s) Performed: COMPUTER ASSISTED TOTAL KNEE ARTHROPLASTY (Right )  Patient Location: PACU  Anesthesia Type:Spinal  Level of Consciousness: awake, alert , oriented and patient cooperative  Airway & Oxygen Therapy: Patient Spontanous Breathing and Patient connected to face mask oxygen  Post-op Assessment: Report given to RN, Post -op Vital signs reviewed and stable and Patient moving all extremities  Post vital signs: Reviewed and stable  Last Vitals:  Vitals Value Taken Time  BP 136/80 03/23/2018  1:15 PM  Temp 36.9 C 03/23/2018  1:09 PM  Pulse 73 03/23/2018  1:15 PM  Resp 16 03/23/2018  1:15 PM  SpO2 96 % 03/23/2018  1:15 PM  Vitals shown include unvalidated device data.  Last Pain:  Vitals:   03/23/18 1309  PainSc: (P) 0-No pain         Complications: No apparent anesthesia complications

## 2018-03-23 NOTE — Op Note (Signed)
OPERATIVE REPORT  SURGEON: Rod Can, MD   ASSISTANT: Nehemiah Massed, PA-C.  PREOPERATIVE DIAGNOSIS: Right knee arthritis.   POSTOPERATIVE DIAGNOSIS: Right knee arthritis.   PROCEDURE: Right total knee arthroplasty.   IMPLANTS: Stryker Triathlon CR femur, size 4. Stryker Tritanium tibia, size 4. X3 polyethelyene insert, size 9 mm, CR. 3 button asymmetric patella, size 32 mm.  ANESTHESIA:  MAC, Regional and Spinal  TOURNIQUET TIME: Not utilized.   ESTIMATED BLOOD LOSS:-550 mL    ANTIBIOTICS: 2 g Ancef.  DRAINS: None.  COMPLICATIONS: None   CONDITION: PACU - hemodynamically stable.   BRIEF CLINICAL NOTE: Pamela Burns is a 68 y.o. female with a long-standing history of Right knee arthritis. After failing conservative management, the patient was indicated for total knee arthroplasty. The risks, benefits, and alternatives to the procedure were explained, and the patient elected to proceed.  PROCEDURE IN DETAIL: Adductor canal block was obtained in the pre-op holding area. Once inside the operative room, spinal anesthesia was obtained, and a foley catheter was inserted. The patient was then positioned, a nonsterile tourniquet was placed, and the lower extremity was prepped and draped in the normal sterile surgical fashion.  A time-out was called verifying side and site of surgery. The patient received IV antibiotics within 60 minutes of beginning the procedure. The tourniquet was not utilized.   An anterior approach to the knee was performed utilizing a midvastus arthrotomy. A medial release was performed and the patellar fat pad was excised. Stryker navigation was used to cut the distal femur perpendicular to the mechanical axis. A freehand patellar resection was performed, and the patella was sized an prepared with 3 lug holes.  Nagivation was used to make a neutral proximal  tibia resection, taking 6 mm of bone from the less affected lateral side with 3 degrees of slope. The menisci were excised. A spacer block was placed, and the alignment and balance in extension were confirmed.   The distal femur was sized using the 3-degree external rotation guide referencing the posterior femoral cortex. The appropriate 4-in-1 cutting block was pinned into place. Rotation was checked using Whiteside's line, the epicondylar axis, and then confirmed with a spacer block in flexion. The remaining femoral cuts were performed, taking care to protect the MCL.  The tibia was sized and the trial tray was pinned into place. The remaining trail components were inserted. The knee was stable to varus and valgus stress through a full range of motion. The patella tracked centrally, and the PCL was well balanced. The trial components were removed, and the proximal tibial surface was prepared. Final components were impacted into place. The knee was tested for a final time and found to be well balanced.   The wound was copiously irrigated with normal saline using pulse lavage.  Marcaine solution was injected into the periarticular soft tissue.  The wound was closed in layers using #1 Vicryl and Stratafix for the fascia, 2-0 Vicryl for the subcutaneous fat, 2-0 Monocryl for the deep dermal layer, 3-0 running Monocryl subcuticular Stitch, and 4-0 Monocryl stay sutures at both ends of the wound. Dermabond was applied to the skin.  Once the glue was fully dried, an Aquacell Ag and compressive dressing were applied.  Tthe patient was transported to the recovery room in stable condition.  Sponge, needle, and instrument counts were correct at the end of the case x2.  The patient tolerated the procedure well and there were no known complications.  Please note that a surgical assistant  was a medical necessity for this procedure in order to perform it in a safe and expeditious manner. Surgical assistant was necessary  to retract the ligaments and vital neurovascular structures to prevent injury to them and also necessary for proper positioning of the limb to allow for anatomic placement of the prosthesis.

## 2018-03-23 NOTE — Anesthesia Procedure Notes (Signed)
Spinal  Patient location during procedure: OR End time: 03/23/2018 10:26 AM Staffing Resident/CRNA: Caryl Pina T, CRNA Performed: resident/CRNA  Preanesthetic Checklist Completed: patient identified, site marked, surgical consent, pre-op evaluation, timeout performed, IV checked, risks and benefits discussed and monitors and equipment checked Spinal Block Patient position: sitting Prep: ChloraPrep Patient monitoring: heart rate, continuous pulse ox and blood pressure Approach: midline Location: L3-4 Injection technique: single-shot Needle Needle type: Pencan  Needle gauge: 24 G Needle length: 9 cm Assessment Sensory level: T6 Additional Notes Expiration date of kit checked and confirmed. Patient tolerated procedure well, without complications.

## 2018-03-23 NOTE — Evaluation (Signed)
Physical Therapy Evaluation Patient Details Name: Pamela Burns MRN: [id number] DOB: May 09, 1950 Today's Date: 03/23/2018   History of Present Illness  Pt s/p R TKR  and with hx of L TKR (18), DM and CAD  Clinical Impression  Pt s/p R TKR and presents with decreased R LE strength/ROM and post op pain limiting functional mobility.  Pt should progress to dc home with family assist.    Follow Up Recommendations Follow surgeon's recommendation for DC plan and follow-up therapies    Equipment Recommendations  None recommended by PT    Recommendations for Other Services       Precautions / Restrictions Precautions Precautions: Knee;Fall Restrictions Weight Bearing Restrictions: No Other Position/Activity Restrictions: WBAT      Mobility  Bed Mobility Overal bed mobility: Needs Assistance Bed Mobility: Supine to Sit     Supine to sit: Min assist     General bed mobility comments: cues for LE management and use of UEs to self assist  Transfers Overall transfer level: Needs assistance Equipment used: Rolling walker (2 wheeled) Transfers: Sit to/from Stand Sit to Stand: Min assist         General transfer comment: cues for LE management and use of UEs to self assist  Ambulation/Gait Ambulation/Gait assistance: Min assist Gait Distance (Feet): 38 Feet Assistive device: Rolling walker (2 wheeled) Gait Pattern/deviations: Step-to pattern;Decreased step length - right;Decreased step length - left;Shuffle;Trunk flexed Gait velocity: decr   General Gait Details: cues for sequence, posture and position from [employer]            Wheelchair Mobility    Modified Rankin (Stroke Patients Only)       Balance Overall balance assessment: Needs assistance Sitting-balance support: Feet supported;No upper extremity supported Sitting balance-Leahy Scale: Good     Standing balance support: Bilateral upper extremity supported Standing balance-Leahy Scale: Fair                                Pertinent Vitals/Pain Pain Assessment: 0-10 Pain Score: 4  Pain Location: R knee Pain Descriptors / Indicators: Aching;Sore Pain Intervention(s): Limited activity within patient's tolerance;Monitored during session;Premedicated before session;Ice applied    Home Living Family/patient expects to be discharged to:: Private residence Living Arrangements: Spouse/significant other Available Help at Discharge: Family Type of Home: House Home Access: Stairs to enter Entrance Stairs-Rails: None Entrance Stairs-Number of Steps: 1 Home Layout: Able to live on main level with bedroom/bathroom Home Equipment: Walker - 2 wheels;Bedside commode      Prior Function Level of Independence: Independent               Hand Dominance        Extremity/Trunk Assessment   Upper Extremity Assessment Upper Extremity Assessment: Overall WFL for tasks assessed    Lower Extremity Assessment Lower Extremity Assessment: RLE deficits/detail    Cervical / Trunk Assessment Cervical / Trunk Assessment: Normal  Communication   Communication: No difficulties  Cognition Arousal/Alertness: Awake/alert Behavior During Therapy: WFL for tasks assessed/performed Overall Cognitive Status: Within Functional Limits for tasks assessed                                        General Comments      Exercises     Assessment/Plan    PT Assessment Patient needs continued PT services  PT Problem List Decreased strength;Decreased range of motion;Decreased activity tolerance;Decreased balance;Decreased mobility;Decreased knowledge of use of DME;Pain       PT Treatment Interventions DME instruction;Gait training;Stair training;Functional mobility training;Therapeutic activities;Therapeutic exercise;Patient/family education    PT Goals (Current goals can be found in the Care Plan section)  Acute Rehab PT Goals Patient Stated Goal: Regain IND PT Goal  Formulation: With patient Time For Goal Achievement: 03/30/18 Potential to Achieve Goals: Good    Frequency 7X/week   Barriers to discharge        Co-evaluation               AM-PAC PT "6 Clicks" Mobility  Outcome Measure Help needed turning from your back to your side while in a flat bed without using bedrails?: A Little Help needed moving from lying on your back to sitting on the side of a flat bed without using bedrails?: A Little Help needed moving to and from a bed to a chair (including a wheelchair)?: A Little Help needed standing up from a chair using your arms (e.g., wheelchair or bedside chair)?: A Little Help needed to walk in hospital room?: A Little Help needed climbing 3-5 steps with a railing? : A Lot 6 Click Score: 17    End of Session Equipment Utilized During Treatment: Gait belt Activity Tolerance: Patient tolerated treatment well Patient left: in chair;with call bell/phone within reach;with family/visitor present Nurse Communication: Mobility status PT Visit Diagnosis: Difficulty in walking, not elsewhere classified (R26.2)    Time: 1607-3710 PT Time Calculation (min) (ACUTE ONLY): 20 min   Charges:   PT Evaluation $PT Eval Low Complexity: 1 Low          [place] Pager [phone] Office [phone]   Antoninette Lerner 03/23/2018, 6:03 PM

## 2018-03-23 NOTE — Plan of Care (Signed)

## 2018-03-23 NOTE — Progress Notes (Signed)
Assisted Dr. Lanetta Inch with right knee adductor canal block. Side rails up, monitors on throughout procedure. See vital signs in flow sheet. Tolerated Procedure well.

## 2018-03-23 NOTE — Discharge Instructions (Signed)
° °Dr. Glenn Christo °Total Joint Specialist °[facility] °[address] °[place], [place] [address] °[phone] ° °TOTAL KNEE REPLACEMENT POSTOPERATIVE DIRECTIONS ° ° ° °Knee Rehabilitation, Guidelines Following Surgery  °Results after knee surgery are often greatly improved when you follow the exercise, range of motion and muscle strengthening exercises prescribed by your doctor. Safety measures are also important to protect the knee from further injury. Any time any of these exercises cause you to have increased pain or swelling in your knee joint, decrease the amount until you are comfortable again and slowly increase them. If you have problems or questions, call your caregiver or physical therapist for advice.  ° °WEIGHT BEARING °Weight bearing as tolerated with assist device (walker, cane, etc) as directed, use it as long as suggested by your surgeon or therapist, typically at least 4-6 weeks. ° °HOME CARE INSTRUCTIONS  °Remove items at home which could result in a fall. This includes throw rugs or furniture in walking pathways.  °Continue medications as instructed at time of discharge. °You may have some home medications which will be placed on hold until you complete the course of blood thinner medication.  °You may start showering once you are discharged home but do not submerge the incision under water. Just pat the incision dry and apply a dry gauze dressing on daily. °Walk with walker as instructed.  °You may resume a sexual relationship in one month or when given the OK by your doctor.  °· Use walker as long as suggested by your caregivers. °· Avoid periods of inactivity such as sitting longer than an hour when not asleep. This helps prevent blood clots.  °You may put full weight on your legs and walk as much as is comfortable.  °You may return to work once you are cleared by your doctor.  °Do not drive a car for 6 weeks or until released by you surgeon.  °· Do not drive  while taking narcotics.  °Wear the elastic stockings for three weeks following surgery during the day but you may remove then at night. °Make sure you keep all of your appointments after your operation with all of your doctors and caregivers. You should call the office at the above phone number and make an appointment for approximately two weeks after the date of your surgery. °Do not remove your surgical dressing. The dressing is waterproof; you may take showers in 3 days, but do not take tub baths or submerge the dressing. °Please pick up a stool softener and laxative for home use as long as you are requiring pain medications. °· ICE to the affected knee every three hours for 30 minutes at a time and then as needed for pain and swelling.  Continue to use ice on the knee for pain and swelling from surgery. You may notice swelling that will progress down to the foot and ankle.  This is normal after surgery.  Elevate the leg when you are not up walking on it.   °It is important for you to complete the blood thinner medication as prescribed by your doctor. °· Continue to use the breathing machine which will help keep your temperature down.  It is common for your temperature to cycle up and down following surgery, especially at night when you are not up moving around and exerting yourself.  The breathing machine keeps your lungs expanded and your temperature down. ° °RANGE OF MOTION AND STRENGTHENING EXERCISES  °Rehabilitation of the knee is important following   a knee injury or an operation. After just a few days of immobilization, the muscles of the thigh which control the knee become weakened and shrink (atrophy). Knee exercises are designed to build up the tone and strength of the thigh muscles and to improve knee motion. Often times heat used for twenty to thirty minutes before working out will loosen up your tissues and help with improving the range of motion but do not use heat for the first two weeks following  surgery. These exercises can be done on a training (exercise) mat, on the floor, on a table or on a bed. Use what ever works the best and is most comfortable for you Knee exercises include:  °Leg Lifts - While your knee is still immobilized in a splint or cast, you can do straight leg raises. Lift the leg to 60 degrees, hold for 3 sec, and slowly lower the leg. Repeat 10-20 times 2-3 times daily. Perform this exercise against resistance later as your knee gets better.  °Quad and Hamstring Sets - Tighten up the muscle on the front of the thigh (Quad) and hold for 5-10 sec. Repeat this 10-20 times hourly. Hamstring sets are done by pushing the foot backward against an object and holding for 5-10 sec. Repeat as with quad sets.  °A rehabilitation program following serious knee injuries can speed recovery and prevent re-injury in the future due to weakened muscles. Contact your doctor or a physical therapist for more information on knee rehabilitation.  ° °SKILLED REHAB INSTRUCTIONS: °If the patient is transferred to a skilled rehab facility following release from the hospital, a list of the current medications will be sent to the facility for the patient to continue.  When discharged from the skilled rehab facility, please have the facility set up the patient's Home Health Physical Therapy prior to being released. Also, the skilled facility will be responsible for providing the patient with their medications at time of release from the facility to include their pain medication, the muscle relaxants, and their blood thinner medication. If the patient is still at the rehab facility at time of the two week follow up appointment, the skilled rehab facility will also need to assist the patient in arranging follow up appointment in our office and any transportation needs. ° °MAKE SURE YOU:  °Understand these instructions.  °Will watch your condition.  °Will get help right away if you are not doing well or get worse.   ° ° °Pick up stool softner and laxative for home use following surgery while on pain medications. °Do NOT remove your dressing. You may shower.  °Do not take tub baths or submerge incision under water. °May shower starting three days after surgery. °Please use a clean towel to pat the incision dry following showers. °Continue to use ice for pain and swelling after surgery. °Do not use any lotions or creams on the incision until instructed by your surgeon. ° °

## 2018-03-23 NOTE — Anesthesia Postprocedure Evaluation (Signed)
Anesthesia Post Note  Patient: Pamela Burns  Procedure(s) Performed: COMPUTER ASSISTED TOTAL KNEE ARTHROPLASTY (Right )     Patient location during evaluation: PACU Anesthesia Type: Regional and Spinal Level of consciousness: awake and alert Pain management: pain level controlled Vital Signs Assessment: post-procedure vital signs reviewed and stable Respiratory status: spontaneous breathing and respiratory function stable Cardiovascular status: blood pressure returned to baseline and stable Postop Assessment: spinal receding Anesthetic complications: no    Last Vitals:  Vitals:   03/23/18 1345 03/23/18 1400  BP: 139/74 (!) 148/74  Pulse: 77 68  Resp: (!) 22 14  Temp:  36.8 C  SpO2: 97% 97%    Last Pain:  Vitals:   03/23/18 1400  PainSc: 0-No pain                 Prudencio Velazco DANIEL

## 2018-03-23 NOTE — Interval H&P Note (Signed)
History and Physical Interval Note:  03/23/2018 7:43 AM  Pamela Burns  has presented today for surgery, with the diagnosis of Degenerative joint disease right knee  The various methods of treatment have been discussed with the patient and family. After consideration of risks, benefits and other options for treatment, the patient has consented to  Procedure(s): COMPUTER ASSISTED TOTAL KNEE ARTHROPLASTY (Right) as a surgical intervention .  The patient's history has been reviewed, patient examined, no change in status, stable for surgery.  I have reviewed the patient's chart and labs.  Questions were answered to the patient's satisfaction.     [name]

## 2018-03-24 ENCOUNTER — Encounter (HOSPITAL_COMMUNITY): Payer: Self-pay | Admitting: Orthopedic Surgery

## 2018-03-24 LAB — BASIC METABOLIC PANEL
Anion gap: 6 (ref 5–15)
BUN: 9 mg/dL (ref 8–23)
CO2: 23 mmol/L (ref 22–32)
Calcium: 8.3 mg/dL — ABNORMAL LOW (ref 8.9–10.3)
Chloride: 108 mmol/L (ref 98–111)
Creatinine, Ser: 0.6 mg/dL (ref 0.44–1.00)
GFR calc Af Amer: >60 mL/min (ref >60)
GFR calc non Af Amer: >60 mL/min (ref >60)
Glucose, Bld: 150 mg/dL — ABNORMAL HIGH (ref 70–99)
POTASSIUM: 4.2 mmol/L (ref 3.5–5.1)
Sodium: 137 mmol/L (ref 135–145)

## 2018-03-24 LAB — GLUCOSE, CAPILLARY
GLUCOSE-CAPILLARY: 129 mg/dL — AB (ref 70–99)
Glucose-Capillary: 117 mg/dL — ABNORMAL HIGH (ref 70–99)
Glucose-Capillary: 147 mg/dL — ABNORMAL HIGH (ref 70–99)
Glucose-Capillary: 132 mg/dL — ABNORMAL HIGH (ref 70–99)

## 2018-03-24 LAB — CBC
HCT: 24 % — ABNORMAL LOW (ref 36.0–46.0)
Hemoglobin: 7.9 g/dL — ABNORMAL LOW (ref 12.0–15.0)
MCH: 28.8 pg (ref 26.0–34.0)
MCHC: 32.9 g/dL (ref 30.0–36.0)
MCV: 87.6 fL (ref 80.0–100.0)
NRBC: 0 % (ref 0.0–0.2)
Platelets: 190 10*3/uL (ref 150–400)
RBC: 2.74 MIL/uL — ABNORMAL LOW (ref 3.87–5.11)
RDW: 12.6 % (ref 11.5–15.5)
WBC: 12.9 10*3/uL — ABNORMAL HIGH (ref 4.0–10.5)

## 2018-03-24 NOTE — Progress Notes (Addendum)
    Subjective:  Patient reports pain as mild to moderate.  Denies N/V/CP/SOB. No c/o.  Objective:   VITALS:   Vitals:   03/23/18 1750 03/23/18 2140 03/24/18 0205 03/24/18 0535  BP: 115/70 (!) 125/59 120/64 122/63  Pulse: 75 82 84 96  Resp: 16     Temp: 98.5 F (36.9 C) 99 F (37.2 C) 98.4 F (36.9 C) 99 F (37.2 C)  TempSrc: Oral Oral  Oral  SpO2: 94% 96% 96% 98%  Weight:      Height:        NAD ABD soft Sensation intact distally Intact pulses distally Dorsiflexion/Plantar flexion intact Incision: dressing C/D/I Compartment soft   Lab Results  Component Value Date   WBC 12.9 (H) 03/24/2018   HGB 7.9 (L) 03/24/2018   HCT 24.0 (L) 03/24/2018   MCV 87.6 03/24/2018   PLT 190 03/24/2018   BMET    Component Value Date/Time   NA 137 03/24/2018 0602   K 4.2 03/24/2018 0602   CL 108 03/24/2018 0602   CO2 23 03/24/2018 0602   GLUCOSE 150 (H) 03/24/2018 0602   BUN 9 03/24/2018 0602   CREATININE 0.60 03/24/2018 0602   CALCIUM 8.3 (L) 03/24/2018 0602   GFRNONAA >60 03/24/2018 0602   GFRAA >60 03/24/2018 0602     Assessment/Plan: 1 Day Post-Op   Principal Problem:   Osteoarthritis of right knee   WBAT with walker DVT ppx: Aspirin, SCDs, TEDS PO pain control ABLA: asymptomatic, monitor PT/OT Dispo: D/C home tomorrow with Rollen Sox Alfredo Collymore 03/24/2018, 9:59 AM   Rod Can, MD Cell: [phone] [facility]  [place] [address], [place], [place] [address] Phone: [phone] [email or web address] Facebook  [employer]

## 2018-03-24 NOTE — Progress Notes (Signed)
Physical Therapy Treatment Patient Details Name: Pamela Burns MRN: [id number] DOB: Mar 10, 1950 Today's Date: 03/24/2018    History of Present Illness Pt s/p R TKR  and with hx of L TKR (18), DM and CAD    PT Comments    Pt motivated and progressing steadily with mobility.  Hgb @ 7.9 this am but pt asymptomatic.    Follow Up Recommendations  Follow surgeon's recommendation for DC plan and follow-up therapies     Equipment Recommendations  None recommended by PT    Recommendations for Other Services       Precautions / Restrictions Precautions Precautions: Knee;Fall Restrictions Weight Bearing Restrictions: No Other Position/Activity Restrictions: WBAT    Mobility  Bed Mobility Overal bed mobility: Needs Assistance Bed Mobility: Supine to Sit     Supine to sit: Min guard     General bed mobility comments: cues for sequence and use of L LE to self assist  Transfers Overall transfer level: Needs assistance Equipment used: Rolling walker (2 wheeled) Transfers: Sit to/from Stand Sit to Stand: Min assist;Min guard         General transfer comment: cues for LE management and use of UEs to self assist  Ambulation/Gait Ambulation/Gait assistance: Min assist;Min guard Gait Distance (Feet): 75 Feet Assistive device: Rolling walker (2 wheeled) Gait Pattern/deviations: Step-to pattern;Decreased step length - right;Decreased step length - left;Shuffle;Trunk flexed Gait velocity: decr   General Gait Details: cues for sequence, posture and position from [employer]             Wheelchair Mobility    Modified Rankin (Stroke Patients Only)       Balance Overall balance assessment: Needs assistance Sitting-balance support: Feet supported;No upper extremity supported Sitting balance-Leahy Scale: Good     Standing balance support: Bilateral upper extremity supported Standing balance-Leahy Scale: Fair                              Cognition  Arousal/Alertness: Awake/alert Behavior During Therapy: WFL for tasks assessed/performed Overall Cognitive Status: Within Functional Limits for tasks assessed                                        Exercises Total Joint Exercises Ankle Circles/Pumps: AROM;Both;20 reps;Supine Quad Sets: AROM;Both;10 reps;Supine Heel Slides: AAROM;Right;15 reps;Supine Straight Leg Raises: AAROM;AROM;Right;10 reps;Supine Goniometric ROM: AAROM R knee -8 - 65    General Comments        Pertinent Vitals/Pain Pain Assessment: 0-10 Pain Score: 5  Pain Location: R knee Pain Descriptors / Indicators: Aching;Sore Pain Intervention(s): Limited activity within patient's tolerance;Monitored during session;Premedicated before session;Ice applied    Home Living                      Prior Function            PT Goals (current goals can now be found in the care plan section) Acute Rehab PT Goals Patient Stated Goal: Regain IND PT Goal Formulation: With patient Time For Goal Achievement: 03/30/18 Potential to Achieve Goals: Good Progress towards PT goals: Progressing toward goals    Frequency    7X/week      PT Plan Current plan remains appropriate    Co-evaluation              AM-PAC PT "6 Clicks" Mobility  Outcome Measure  Help needed turning from your back to your side while in a flat bed without using bedrails?: A Little Help needed moving from lying on your back to sitting on the side of a flat bed without using bedrails?: A Little Help needed moving to and from a bed to a chair (including a wheelchair)?: A Little Help needed standing up from a chair using your arms (e.g., wheelchair or bedside chair)?: A Little Help needed to walk in hospital room?: A Little Help needed climbing 3-5 steps with a railing? : A Little 6 Click Score: 18    End of Session Equipment Utilized During Treatment: Gait belt Activity Tolerance: Patient tolerated treatment  well Patient left: in chair;with call bell/phone within reach Nurse Communication: Mobility status PT Visit Diagnosis: Difficulty in walking, not elsewhere classified (R26.2)     Time: 8416-6063 PT Time Calculation (min) (ACUTE ONLY): 34 min  Charges:  $Gait Training: 8-22 mins $Therapeutic Exercise: 8-22 mins                     [place] Pager [phone] Office [phone]    Meril Dray 03/24/2018, 10:21 AM

## 2018-03-24 NOTE — Progress Notes (Signed)
Physical Therapy Treatment Patient Details Name: Pamela Burns MRN: [id number] DOB: August 14, 1950 Today's Date: 03/24/2018    History of Present Illness Pt s/p R TKR  and with hx of L TKR (18), DM and CAD    PT Comments    Pt progressing slowly with mobility this pm - ltd by fatigue/pain.   Follow Up Recommendations  Follow surgeon's recommendation for DC plan and follow-up therapies     Equipment Recommendations  None recommended by PT    Recommendations for Other Services       Precautions / Restrictions Precautions Precautions: Knee;Fall Restrictions Weight Bearing Restrictions: No Other Position/Activity Restrictions: WBAT    Mobility  Bed Mobility Overal bed mobility: Needs Assistance Bed Mobility: Supine to Sit;Sit to Supine     Supine to sit: Min guard Sit to supine: Min assist   General bed mobility comments: cues for sequence and use of L LE to self assist  Transfers Overall transfer level: Needs assistance Equipment used: Rolling walker (2 wheeled) Transfers: Sit to/from Stand Sit to Stand: Min guard         General transfer comment: cues for LE management and use of UEs to self assist  Ambulation/Gait Ambulation/Gait assistance: Min assist;Min guard Gait Distance (Feet): 70 Feet Assistive device: Rolling walker (2 wheeled) Gait Pattern/deviations: Step-to pattern;Decreased step length - right;Decreased step length - left;Shuffle;Trunk flexed Gait velocity: decr   General Gait Details: cues for sequence, posture and position from [employer]             Wheelchair Mobility    Modified Rankin (Stroke Patients Only)       Balance Overall balance assessment: Needs assistance Sitting-balance support: Feet supported;No upper extremity supported Sitting balance-Leahy Scale: Good     Standing balance support: Bilateral upper extremity supported Standing balance-Leahy Scale: Fair                              Cognition  Arousal/Alertness: Awake/alert Behavior During Therapy: WFL for tasks assessed/performed Overall Cognitive Status: Within Functional Limits for tasks assessed                                        Exercises Total Joint Exercises Ankle Circles/Pumps: AROM;Both;20 reps;Supine Quad Sets: AROM;Both;10 reps;Supine Heel Slides: AAROM;Right;15 reps;Supine Straight Leg Raises: AAROM;AROM;Right;10 reps;Supine    General Comments        Pertinent Vitals/Pain Pain Assessment: 0-10 Pain Score: 6  Pain Location: R knee Pain Descriptors / Indicators: Aching;Sore Pain Intervention(s): Limited activity within patient's tolerance;Monitored during session;Premedicated before session;Ice applied    Home Living                      Prior Function            PT Goals (current goals can now be found in the care plan section) Acute Rehab PT Goals Patient Stated Goal: Regain IND PT Goal Formulation: With patient Time For Goal Achievement: 03/30/18 Potential to Achieve Goals: Good Progress towards PT goals: Progressing toward goals    Frequency    7X/week      PT Plan Current plan remains appropriate    Co-evaluation              AM-PAC PT "6 Clicks" Mobility   Outcome Measure  Help needed turning from your  back to your side while in a flat bed without using bedrails?: A Little Help needed moving from lying on your back to sitting on the side of a flat bed without using bedrails?: A Little Help needed moving to and from a bed to a chair (including a wheelchair)?: A Little Help needed standing up from a chair using your arms (e.g., wheelchair or bedside chair)?: A Little Help needed to walk in hospital room?: A Little Help needed climbing 3-5 steps with a railing? : A Little 6 Click Score: 18    End of Session Equipment Utilized During Treatment: Gait belt Activity Tolerance: Patient tolerated treatment well Patient left: in bed;with call  bell/phone within reach Nurse Communication: Mobility status PT Visit Diagnosis: Difficulty in walking, not elsewhere classified (R26.2)     Time: 1530-1605 PT Time Calculation (min) (ACUTE ONLY): 35 min  Charges:  $Gait Training: 8-22 mins $Therapeutic Exercise: 8-22 mins                     [place] Pager [phone] Office [phone]    Caasi Giglia 03/24/2018, 4:22 PM

## 2018-03-25 LAB — CBC
HCT: 24.1 % — ABNORMAL LOW (ref 36.0–46.0)
HEMOGLOBIN: 8 g/dL — AB (ref 12.0–15.0)
MCH: 28.4 pg (ref 26.0–34.0)
MCHC: 33.2 g/dL (ref 30.0–36.0)
MCV: 85.5 fL (ref 80.0–100.0)
Platelets: 216 10*3/uL (ref 150–400)
RBC: 2.82 MIL/uL — ABNORMAL LOW (ref 3.87–5.11)
RDW: 12.5 % (ref 11.5–15.5)
WBC: 10 10*3/uL (ref 4.0–10.5)
nRBC: 0 % (ref 0.0–0.2)

## 2018-03-25 LAB — GLUCOSE, CAPILLARY: Glucose-Capillary: 167 mg/dL — ABNORMAL HIGH (ref 70–99)

## 2018-03-25 MED ORDER — HYDROMORPHONE HCL 2 MG PO TABS: 2.0000 mg | ORAL_TABLET | ORAL | 0 refills | Status: DC | PRN

## 2018-03-25 MED ORDER — SENNA 8.6 MG PO TABS: 2.0000 | ORAL_TABLET | Freq: Every day | ORAL | 1 refills | Status: DC

## 2018-03-25 MED ORDER — ONDANSETRON HCL 4 MG PO TABS: 4.0000 mg | ORAL_TABLET | Freq: Four times a day (QID) | ORAL | 0 refills | Status: DC | PRN

## 2018-03-25 MED ORDER — DOCUSATE SODIUM 100 MG PO CAPS: 100.0000 mg | ORAL_CAPSULE | Freq: Two times a day (BID) | ORAL | 1 refills | Status: DC

## 2018-03-25 MED ORDER — ASPIRIN 81 MG PO CHEW: 81.0000 mg | CHEWABLE_TABLET | Freq: Two times a day (BID) | ORAL | 1 refills | Status: DC

## 2018-03-25 MED ORDER — METHOCARBAMOL 500 MG PO TABS: 500.0000 mg | ORAL_TABLET | Freq: Four times a day (QID) | ORAL | 0 refills | Status: DC | PRN

## 2018-03-25 NOTE — Progress Notes (Signed)
    Subjective:  Patient reports pain as mild to moderate.  Denies N/V/CP/SOB. No c/o.  Objective:   VITALS:   Vitals:   03/24/18 1042 03/24/18 2120 03/25/18 0504 03/25/18 0633  BP: (!) 149/66 (!) 156/76 (!) 162/78 (!) 156/75  Pulse: 88 89 96 97  Resp: 16 20 20    Temp: 98.8 F (37.1 C) (!) 100.6 F (38.1 C) 98.1 F (36.7 C)   TempSrc: Oral Oral Oral   SpO2: 100% 95% (!) 89%   Weight:      Height:        NAD ABD soft Sensation intact distally Intact pulses distally Dorsiflexion/Plantar flexion intact Incision: dressing C/D/I Compartment soft   Lab Results  Component Value Date   WBC 10.0 03/25/2018   HGB 8.0 (L) 03/25/2018   HCT 24.1 (L) 03/25/2018   MCV 85.5 03/25/2018   PLT 216 03/25/2018   BMET    Component Value Date/Time   NA 137 03/24/2018 0602   K 4.2 03/24/2018 0602   CL 108 03/24/2018 0602   CO2 23 03/24/2018 0602   GLUCOSE 150 (H) 03/24/2018 0602   BUN 9 03/24/2018 0602   CREATININE 0.60 03/24/2018 0602   CALCIUM 8.3 (L) 03/24/2018 0602   GFRNONAA >60 03/24/2018 0602   GFRAA >60 03/24/2018 0602     Assessment/Plan: 2 Days Post-Op   Principal Problem:   Osteoarthritis of right knee   WBAT with walker DVT ppx: Aspirin, SCDs, TEDS PO pain control ABLA: asymptomatic, monitor PT/OT Dispo: D/C home with OPPT   Hilton Cork Zamaria Brazzle 03/25/2018, 7:58 AM   Rod Can, MD Cell: [phone] [facility]  [place] [address], [place], [place] [address] Phone: [phone] [email or web address] Facebook  [employer]

## 2018-03-25 NOTE — Progress Notes (Signed)
Physical Therapy Treatment Patient Details Name: Pamela Burns MRN: [id number] DOB: 1950-10-19 Today's Date: 03/25/2018    History of Present Illness Pt s/p R TKR  and with hx of L TKR (18), DM and CAD    PT Comments    Pt cooperative but ltd by pain/fatigue.  Pt up to bathroom, to sink to brush teeth and ambulated short distance in hall to negotiate step into home.  Spouse present and written instruction provided.   Follow Up Recommendations  Follow surgeon's recommendation for DC plan and follow-up therapies     Equipment Recommendations  None recommended by PT    Recommendations for Other Services       Precautions / Restrictions Precautions Precautions: Knee;Fall Restrictions Weight Bearing Restrictions: No Other Position/Activity Restrictions: WBAT    Mobility  Bed Mobility Overal bed mobility: Needs Assistance Bed Mobility: Supine to Sit;Sit to Supine     Supine to sit: Min guard Sit to supine: Min assist   General bed mobility comments: cues for sequence and physical assist to manage R LE  Transfers Overall transfer level: Needs assistance Equipment used: Rolling walker (2 wheeled) Transfers: Sit to/from Stand Sit to Stand: Min guard;Supervision         General transfer comment: cues for LE management and use of UEs to self assist  Ambulation/Gait Ambulation/Gait assistance: Min guard;Supervision Gait Distance (Feet): 60 Feet Assistive device: Rolling walker (2 wheeled) Gait Pattern/deviations: Step-to pattern;Decreased step length - right;Decreased step length - left;Shuffle;Trunk flexed Gait velocity: decr   General Gait Details: cues for sequence, posture and position from RW   Stairs Stairs: Yes Stairs assistance: Min assist Stair Management: No rails;Step to pattern;Backwards;With walker Number of Stairs: 1 General stair comments: cues for sequence and foot/RW placement; spouse present; pt declines to attempt x 2 2* fatigue; written  instruction provided   Wheelchair Mobility    Modified Rankin (Stroke Patients Only)       Balance Overall balance assessment: Needs assistance Sitting-balance support: Feet supported;No upper extremity supported Sitting balance-Leahy Scale: Good     Standing balance support: Bilateral upper extremity supported Standing balance-Leahy Scale: Fair                              Cognition Arousal/Alertness: Awake/alert Behavior During Therapy: WFL for tasks assessed/performed Overall Cognitive Status: Within Functional Limits for tasks assessed                                        Exercises      General Comments        Pertinent Vitals/Pain Pain Assessment: 0-10 Pain Score: 7  Pain Location: R knee Pain Descriptors / Indicators: Aching;Sore Pain Intervention(s): Limited activity within patient's tolerance;Monitored during session;Premedicated before session;Ice applied    Home Living                      Prior Function            PT Goals (current goals can now be found in the care plan section) Acute Rehab PT Goals Patient Stated Goal: Regain IND PT Goal Formulation: With patient Time For Goal Achievement: 03/30/18 Potential to Achieve Goals: Good Progress towards PT goals: Progressing toward goals    Frequency    7X/week      PT Plan Current plan remains appropriate  Co-evaluation              AM-PAC PT "6 Clicks" Mobility   Outcome Measure  Help needed turning from your back to your side while in a flat bed without using bedrails?: A Little Help needed moving from lying on your back to sitting on the side of a flat bed without using bedrails?: A Little Help needed moving to and from a bed to a chair (including a wheelchair)?: A Little Help needed standing up from a chair using your arms (e.g., wheelchair or bedside chair)?: A Little Help needed to walk in hospital room?: A Little Help needed  climbing 3-5 steps with a railing? : A Little 6 Click Score: 18    End of Session Equipment Utilized During Treatment: Gait belt Activity Tolerance: Patient tolerated treatment well Patient left: in bed;with call bell/phone within reach Nurse Communication: Mobility status PT Visit Diagnosis: Difficulty in walking, not elsewhere classified (R26.2)     Time: 8127-5170 PT Time Calculation (min) (ACUTE ONLY): 23 min  Charges:  $Gait Training: 8-22 mins $Therapeutic Activity: 8-22 mins                     [place] Pager [phone] Office [phone]   Kielee Care 03/25/2018, 12:16 PM

## 2018-03-25 NOTE — Discharge Summary (Signed)
Physician Discharge Summary  Patient ID: Pamela Burns MRN: [id number] DOB/AGE: 1950-06-08 68 y.o.  Admit date: 03/23/2018 Discharge date: 03/25/2018  Admission Diagnoses:  Osteoarthritis of right knee  Discharge Diagnoses:  Principal Problem:   Osteoarthritis of right knee   Past Medical History:  Diagnosis Date  . Aortic insufficiency    trace  . Cancer [facility])    thyroid right side  . Coronary artery disease   . Diabetes mellitus without complication ([place])   . GERD (gastroesophageal reflux disease)   . History of bronchitis 01/2018  . Hyperlipidemia   . Hypertension   . Hypertensive heart disease   . Hypothyroidism   . Mitral insufficiency    trace  . Moderate concentric left ventricular hypertrophy   . Myocardial infarction ([place]) 07/01/2002  . Osteoarthritis of left knee 02/02/2017  . Osteoarthritis of left knee   . Pneumonia    history of  . PONV (postoperative nausea and vomiting)   . Tricuspid insufficiency    trace    Surgeries: Procedure(s): COMPUTER ASSISTED TOTAL KNEE ARTHROPLASTY on 03/23/2018   Consultants (if any):   Discharged Condition: Improved  Hospital Course: Pamela Burns is an 68 y.o. female who was admitted 03/23/2018 with a diagnosis of Osteoarthritis of right knee and went to the operating room on 03/23/2018 and underwent the above named procedures.    She was given perioperative antibiotics:  Anti-infectives (From admission, onward)   Start     Dose/Rate Route Frequency Ordered Stop   03/23/18 1630  ceFAZolin (ANCEF) IVPB 1 g/50 mL premix     1 g 100 mL/hr over 30 Minutes Intravenous Every 6 hours 03/23/18 1423 03/23/18 2321   03/23/18 0645  ceFAZolin (ANCEF) IVPB 2g/100 mL premix     2 g 200 mL/hr over 30 Minutes Intravenous On call to O.R. 03/23/18 4098 03/23/18 1038    .  She was given sequential compression devices, early ambulation, and ASA + Plavix for DVT prophylaxis.  She benefited maximally from the hospital stay and  there were no complications.    Recent vital signs:  Vitals:   03/25/18 0504 03/25/18 0633  BP: (!) 162/78 (!) 156/75  Pulse: 96 97  Resp: 20   Temp: 98.1 F (36.7 C)   SpO2: (!) 89%     Recent laboratory studies:  Lab Results  Component Value Date   HGB 8.0 (L) 03/25/2018   HGB 7.9 (L) 03/24/2018   HGB 11.9 (L) 03/15/2018   Lab Results  Component Value Date   WBC 10.0 03/25/2018   PLT 216 03/25/2018   No results found for: INR Lab Results  Component Value Date   NA 137 03/24/2018   K 4.2 03/24/2018   CL 108 03/24/2018   CO2 23 03/24/2018   BUN 9 03/24/2018   CREATININE 0.60 03/24/2018   GLUCOSE 150 (H) 03/24/2018    Discharge Medications:   Allergies as of 03/25/2018      Reactions   Shellfish Allergy Anaphylaxis, Swelling   Benadryl [diphenhydramine] Hives   Codeine Nausea Only   Lipitor [atorvastatin] Other (See Comments)   Chest pain   Lortab [hydrocodone-acetaminophen] Nausea And Vomiting   Zocor [simvastatin] Other (See Comments)   Chest pain      Medication List    STOP taking these medications   diclofenac sodium 1 % Gel Commonly known as:  VOLTAREN     TAKE these medications   aspirin 81 MG chewable tablet Chew 1 tablet (81 mg total) by  mouth 2 (two) times daily.   CALCIUM 600/VITAMIN D3 PO Take 2 tablets by mouth daily.   celecoxib 200 MG capsule Commonly known as:  CELEBREX Take 200 mg by mouth daily.   clopidogrel 75 MG tablet Commonly known as:  PLAVIX Take 75 mg by mouth daily.   docusate sodium 100 MG capsule Commonly known as:  COLACE Take 1 capsule (100 mg total) by mouth 2 (two) times daily.   HYDROmorphone 2 MG tablet Commonly known as:  DILAUDID Take 1-2 tablets (2-4 mg total) by mouth every 4 (four) hours as needed for moderate pain or severe pain.   levothyroxine 100 MCG tablet Commonly known as:  SYNTHROID, LEVOTHROID Take 100 mcg by mouth daily before breakfast.   metFORMIN 500 MG tablet Commonly known as:   GLUCOPHAGE Take 500 mg by mouth 2 (two) times daily.   methocarbamol 500 MG tablet Commonly known as:  ROBAXIN Take 1 tablet (500 mg total) by mouth every 6 (six) hours as needed for muscle spasms.   metoprolol tartrate 50 MG tablet Commonly known as:  LOPRESSOR Take 50 mg by mouth daily.   MULTIVITAMIN PO Take 1 tablet by mouth daily.   NEXIUM 24HR 20 MG capsule Generic drug:  esomeprazole Take 40 mg by mouth daily.   ondansetron 4 MG tablet Commonly known as:  ZOFRAN Take 1 tablet (4 mg total) by mouth every 6 (six) hours as needed for nausea.   Potassium 99 MG Tabs Take 99 mg by mouth 3 (three) times a week.   rosuvastatin 5 MG tablet Commonly known as:  CRESTOR Take 5 mg by mouth daily.   senna 8.6 MG Tabs tablet Commonly known as:  SENOKOT Take 2 tablets (17.2 mg total) by mouth at bedtime.   VITAMIN D3 PO Take 2 capsules by mouth daily.       Diagnostic Studies: Dg Chest 2 View  Result Date: 03/15/2018 CLINICAL DATA:  Preoperative respiratory evaluation prior to RIGHT total knee arthroplasty. Remote former smoker. EXAM: CHEST - 2 VIEW COMPARISON:  None. FINDINGS: Cardiomediastinal silhouette unremarkable. Lungs clear. Bronchovascular markings normal. Pulmonary vascularity normal. No visible pleural effusions. No pneumothorax. Degenerative changes involving the thoracic and UPPER lumbar spine. IMPRESSION: No acute cardiopulmonary disease. Electronically Signed   By: Evangeline Dakin M.D.   On: 03/15/2018 16:55   Dg Knee Right Port  Result Date: 03/23/2018 CLINICAL DATA:  Status post right knee replacement EXAM: PORTABLE RIGHT KNEE - 2 VIEW COMPARISON:  None. FINDINGS: Right knee prosthesis is noted. No acute bony or soft tissue abnormality is noted. IMPRESSION: Status post right knee replacement Electronically Signed   By: Inez Catalina M.D.   On: 03/23/2018 13:52    Disposition: Discharge disposition: 01-Home or Self Care       Discharge Instructions     Call MD / Call 911   Complete by:  As directed    If you experience chest pain or shortness of breath, CALL 911 and be transported to the hospital emergency room.  If you develope a fever above 101 F, pus (white drainage) or increased drainage or redness at the wound, or calf pain, call your surgeon's office.   Constipation Prevention   Complete by:  As directed    Drink plenty of fluids.  Prune juice may be helpful.  You may use a stool softener, such as Colace (over the counter) 100 mg twice a day.  Use MiraLax (over the counter) for constipation as needed.   Diet -  low sodium heart healthy   Complete by:  As directed    Do not put a pillow under the knee. Place it under the heel.   Complete by:  As directed    Driving restrictions   Complete by:  As directed    No driving for 6 weeks   Increase activity slowly as tolerated   Complete by:  As directed    Lifting restrictions   Complete by:  As directed    No lifting for 6 weeks   TED hose   Complete by:  As directed    Use stockings (TED hose) for 2 weeks on both leg(s).  You may remove them at night for sleeping.      Follow-up Information    Miosha Behe, Aaron Edelman, MD. Schedule an appointment as soon as possible for a visit in 2 weeks.   Specialty:  Orthopedic Surgery Why:  For wound re-check Contact information: [address] [place] [address] [phone]            Signed: Hilton Cork Lorice Lafave 03/25/2018, 1:57 PM

## 2018-03-25 NOTE — Progress Notes (Signed)
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  Cold pack applied following and will return after bfast for ambulation and stair training.   Follow Up Recommendations  Follow surgeon's recommendation for DC plan and follow-up therapies     Equipment Recommendations  None recommended by PT    Recommendations for Other Services       Precautions / Restrictions Precautions Precautions: Knee;Fall Restrictions Weight Bearing Restrictions: No Other Position/Activity Restrictions: WBAT    Mobility  Bed Mobility               General bed mobility comments: OOB deferred to after bfast  Transfers                    Ambulation/Gait                 Stairs             Wheelchair Mobility    Modified Rankin (Stroke Patients Only)       Balance                                            Cognition Arousal/Alertness: Awake/alert Behavior During Therapy: WFL for tasks assessed/performed Overall Cognitive Status: Within Functional Limits for tasks assessed                                        Exercises Total Joint Exercises Ankle Circles/Pumps: AROM;Both;20 reps;Supine Quad Sets: AROM;Both;Supine;15 reps Heel Slides: AAROM;Right;15 reps;Supine Straight Leg Raises: AAROM;Right;Supine;15 reps Goniometric ROM: AAROM R knee -8 - 45 pain limited    General Comments        Pertinent Vitals/Pain Pain Assessment: 0-10 Pain Score: 7  Pain Location: R knee Pain Descriptors / Indicators: Aching;Sore Pain Intervention(s): Limited activity within patient's tolerance;Monitored during session;Premedicated before session;Ice applied    Home Living                       Prior Function            PT Goals (current goals can now be found in the care plan section) Acute Rehab PT Goals Patient Stated Goal: Regain IND PT Goal Formulation: With patient Time For Goal Achievement: 03/30/18 Potential to Achieve Goals: Good Progress towards PT goals: Progressing toward goals    Frequency    7X/week      PT Plan Current plan remains appropriate    Co-evaluation              AM-PAC PT "6 Clicks" Mobility   Outcome Measure  Help needed turning from your back to your side while in a flat bed without using bedrails?: A Little Help needed moving from lying on your back to sitting on the side of a flat bed without using bedrails?: A Little Help needed moving to and from a bed to a chair (including a wheelchair)?: A Little Help needed standing up from a chair using your arms (e.g., wheelchair or  bedside chair)?: A Little Help needed to walk in hospital room?: A Little Help needed climbing 3-5 steps with a railing? : A Little 6 Click Score: 18    End of Session Equipment Utilized During Treatment: Gait belt Activity Tolerance: Patient tolerated treatment well Patient left: in bed;with call bell/phone within reach Nurse Communication: Mobility status PT Visit Diagnosis: Difficulty in walking, not elsewhere classified (R26.2)     Time: [id number] PT Time Calculation (min) (ACUTE ONLY): 22 min  Charges:  $Therapeutic Exercise: 8-22 mins                     Debe Coder PT Acute Rehabilitation Services Pager [phone] Office [phone]    [facility] 03/25/2018, 8:33 AM

## 2018-03-25 NOTE — Plan of Care (Signed)
  Problem: Clinical Measurements: Goal: Will remain free from infection Outcome: Progressing   Problem: Clinical Measurements: Goal: Diagnostic test results will improve Outcome: Progressing   Problem: Clinical Measurements: Goal: Cardiovascular complication will be avoided Outcome: Progressing   Problem: Pain Managment: Goal: General experience of comfort will improve Outcome: Progressing   Problem: Skin Integrity: Goal: Risk for impaired skin integrity will decrease Outcome: Progressing

## 2018-03-25 NOTE — Care Management Note (Signed)
Case Management Note  Patient Details  Name: Pamela Burns MRN: [id number] Date of Birth: [birth year]  Subjective/Objective:                  discharged  Action/Plan: Will do outpt pt, has needed dme, has assistance at home.  Expected Discharge Date:  03/25/18               Expected Discharge Plan:     In-House Referral:     Discharge planning Services     Post Acute Care Choice:    Choice offered to:     DME Arranged:    DME Agency:     HH Arranged:    HH Agency:     Status of Service:     If discussed at [employer], dates discussed:    Additional Comments:  Leeroy Cha, RN 03/25/2018, 9:42 AM

## 2020-09-15 IMAGING — DX DG KNEE 1-2V PORT*R*
2 series · 2 of 2 positions shown · non-contrast
Comparison: None.

CLINICAL DATA: Status post right knee replacement

EXAM:
PORTABLE RIGHT KNEE - 2 VIEW

[knee ap]
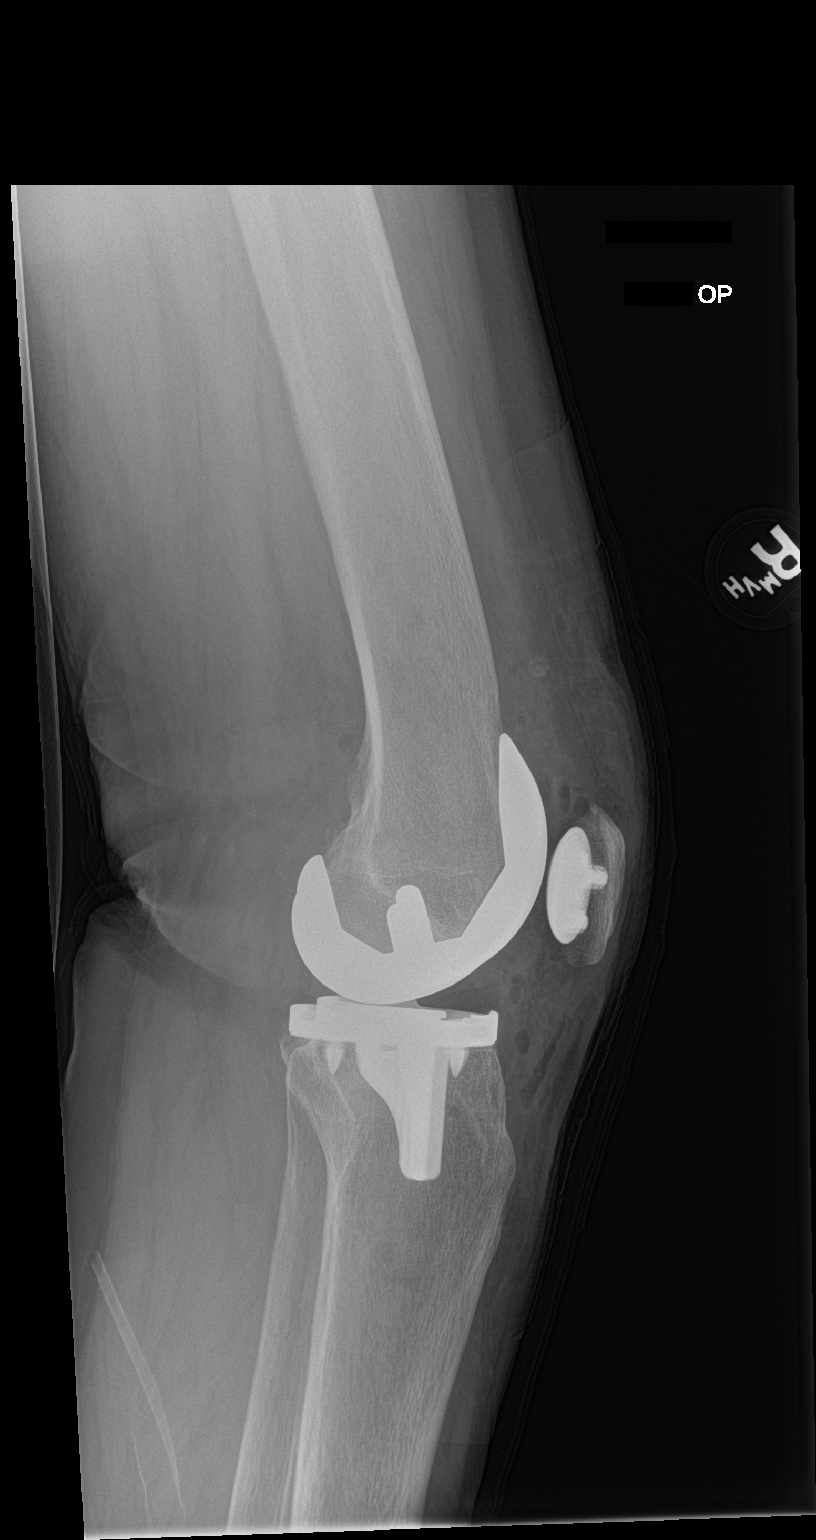

[knee lat]
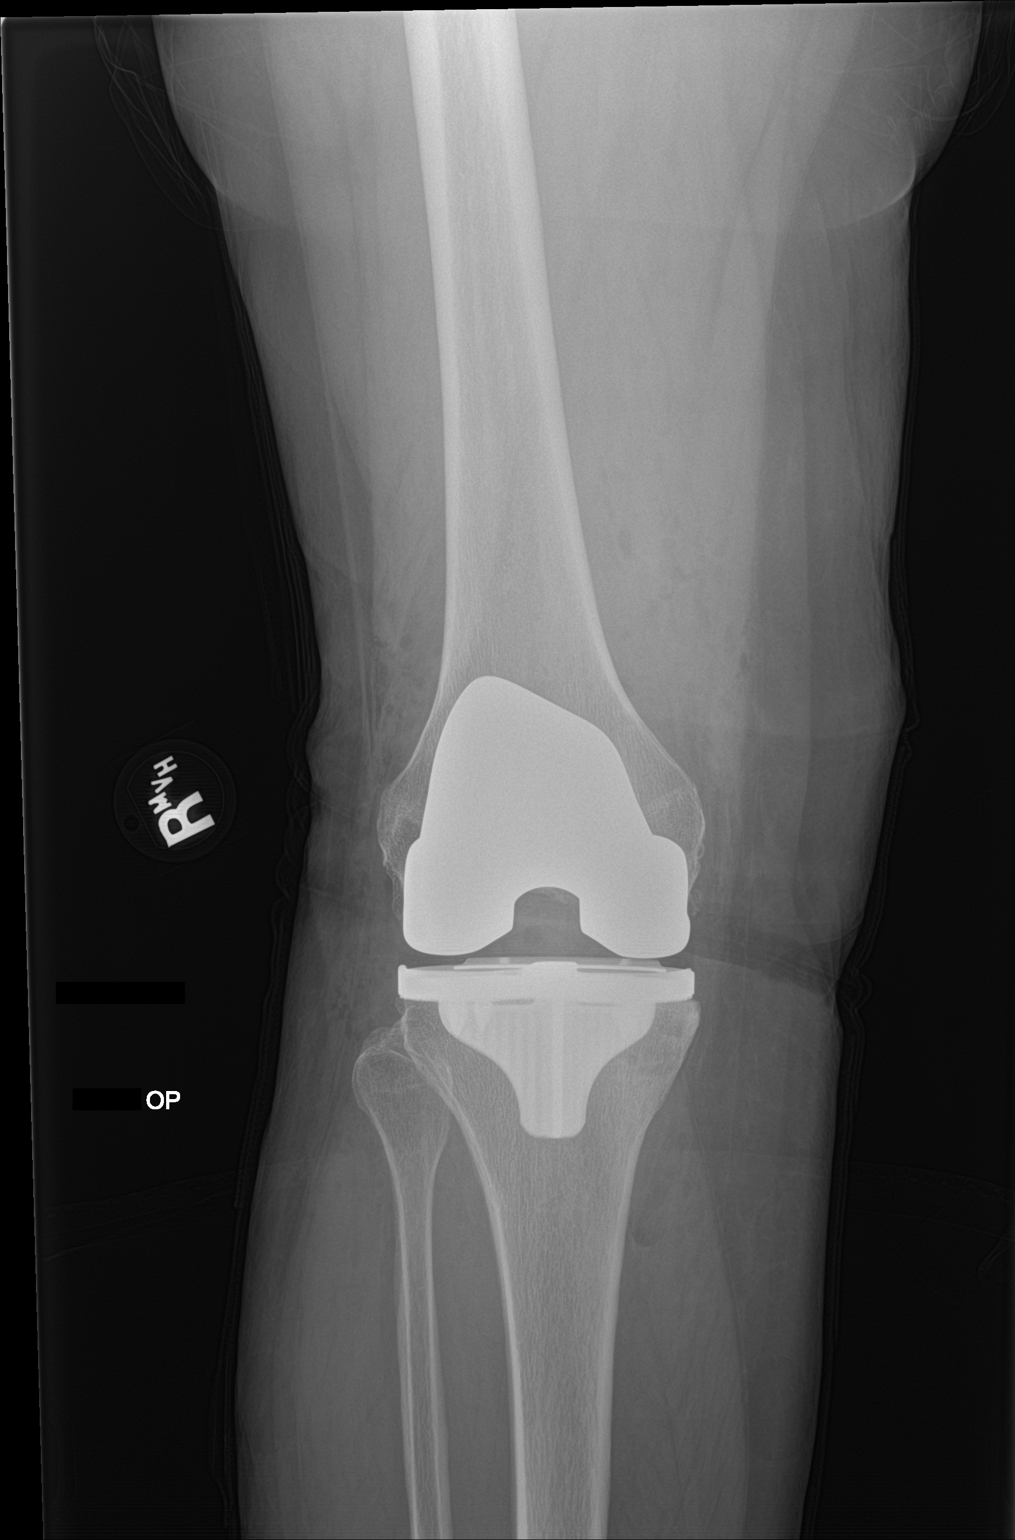

[2 of 2 positions shown; findings below may reference images not displayed]

FINDINGS: Right knee prosthesis is noted. No acute bony or soft tissue
abnormality is noted.
IMPRESSION: Status post right knee replacement

## 2021-04-15 NOTE — Patient Instructions (Signed)
DUE TO COVID-19 ONLY ONE VISITOR IS ALLOWED TO COME WITH YOU AND STAY IN THE WAITING ROOM ONLY DURING PRE OP AND PROCEDURE.   **NO VISITORS ARE ALLOWED IN THE SHORT STAY AREA OR RECOVERY ROOM!!**  IF YOU WILL BE ADMITTED INTO THE HOSPITAL YOU ARE ALLOWED ONLY TWO SUPPORT PEOPLE DURING VISITATION HOURS ONLY (7 AM -8PM)   The support person(s) must pass our screening, gel in and out, and wear a mask at all times, including in the patients room. Patients must also wear a mask when staff or their support person are in the room. Visitors GUEST BADGE MUST BE WORN VISIBLY  One adult visitor may remain with you overnight and MUST be in the room by 8 P.M.  No visitors under the age of 71. Any visitor under the age of 15 must be accompanied by an adult.        Your procedure is scheduled on: 04/24/21   Report to [facility] Main Entrance    Report to admitting at: 12:00 PM   Call this number if you have problems the morning of surgery [phone]   Do not eat food :After Midnight.   May have liquids until: 11:45 AM    day of surgery  CLEAR LIQUID DIET  Foods Allowed                                                                     Foods Excluded  Water, Black Coffee and tea, regular and decaf                             liquids that you cannot  Plain Jell-O in any flavor  (No red)                                           see through such as: Fruit ices (not with fruit pulp)                                     milk, soups, orange juice              Iced Popsicles (No red)                                    All solid food                                   Apple juices Sports drinks like Gatorade (No red) Lightly seasoned clear broth or consume(fat free) Sugar Sample Menu Breakfast                                Lunch  Supper Cranberry juice                    Beef broth                            Chicken broth Jell-O                                      Grape juice                           Apple juice Coffee or tea                        Jell-O                                      Popsicle                                                Coffee or tea                        Coffee or tea   Complete one Gatorade drink the morning of surgery 3 hours prior to scheduled surgery at: 11:45 AM.    The day of surgery:  Drink ONE (1) Pre-Surgery Clear Ensure or G2 at AM the morning of surgery. Drink in one sitting. Do not sip.  This drink was given to you during your hospital  pre-op appointment visit. Nothing else to drink after completing the  Pre-Surgery Clear Ensure or G2.          If you have questions, please contact your surgeons office.  FOLLOW BOWEL PREP AND ANY ADDITIONAL PRE OP INSTRUCTIONS YOU RECEIVED FROM YOUR SURGEON'S OFFICE!!!   Oral Hygiene is also important to reduce your risk of infection.                                    Remember - BRUSH YOUR TEETH THE MORNING OF SURGERY WITH YOUR REGULAR TOOTHPASTE   Do NOT smoke after Midnight   Take these medicines the morning of surgery with A SIP OF WATER: isosorbide,metoprolol,synthroid,esomeprazole. How to Manage Your Diabetes Before and After Surgery  Why is it important to control my blood sugar before and after surgery? Improving blood sugar levels before and after surgery helps healing and can limit problems. A way of improving blood sugar control is eating a healthy diet by:  Eating less sugar and carbohydrates  Increasing activity/exercise  Talking with your doctor about reaching your blood sugar goals High blood sugars (greater than 180 mg/dL) can raise your risk of infections and slow your recovery, so you will need to focus on controlling your diabetes during the weeks before surgery. Make sure that the doctor who takes care of your diabetes knows about your planned surgery including the date and location.  How do I manage my blood sugar before surgery? Check  your blood sugar at least 4 times a day, starting 2  days before surgery, to make sure that the level is not too high or low. Check your blood sugar the morning of your surgery when you wake up and every 2 hours until you get to the Short Stay unit. If your blood sugar is less than 70 mg/dL, you will need to treat for low blood sugar: Do not take insulin. Treat a low blood sugar (less than 70 mg/dL) with  cup of clear juice (cranberry or apple), 4 glucose tablets, OR glucose gel. Recheck blood sugar in 15 minutes after treatment (to make sure it is greater than 70 mg/dL). If your blood sugar is not greater than 70 mg/dL on recheck, call [phone] for further instructions. Report your blood sugar to the short stay nurse when you get to Short Stay.  If you are admitted to the hospital after surgery: Your blood sugar will be checked by the staff and you will probably be given insulin after surgery (instead of oral diabetes medicines) to make sure you have good blood sugar levels. The goal for blood sugar control after surgery is 80-180 mg/dL.   WHAT DO I DO ABOUT MY DIABETES MEDICATION?  Do not take oral diabetes medicines (pills) the morning of surgery.  THE NIGHT BEFORE SURGERY, take metformin as usual.      THE MORNING OF SURGERY,DO NOT TAKE ANY ORAL DIABETIC MEDICATIONS DAY OF YOUR SURGERY                              You may not have any metal on your body including hair pins, jewelry, and body piercing             Do not wear make-up, lotions, powders, perfumes/cologne, or deodorant  Do not wear nail polish including gel and S&S, artificial/acrylic nails, or any other type of covering on natural nails including finger and toenails. If you have artificial nails, gel coating, etc. that needs to be removed by a nail salon please have this removed prior to surgery or surgery may need to be canceled/ delayed if the surgeon/ anesthesia feels like they are unable to be safely monitored.    Do not shave  48 hours prior to surgery.               Men may shave face and neck.   Do not bring valuables to the hospital. [place].   Contacts, dentures or bridgework may not be worn into surgery.   Bring small overnight bag day of surgery.    Patients discharged on the day of surgery will not be allowed to drive home.  Someone needs to stay with you for the first 24 hours after anesthesia.   Special Instructions: Bring a copy of your healthcare power of attorney and living will documents         the day of surgery if you haven't scanned them before.              Please read over the following fact sheets you were given: IF YOU HAVE QUESTIONS ABOUT YOUR PRE-OP INSTRUCTIONS PLEASE CALL [phone]     [facility] - Preparing for Surgery Before surgery, you can play an important role.  Because skin is not sterile, your skin needs to be as free of germs as possible.  You can reduce the number of germs  on your skin by washing with CHG (chlorahexidine gluconate) soap before surgery.  CHG is an antiseptic cleaner which kills germs and bonds with the skin to continue killing germs even after washing. Please DO NOT use if you have an allergy to CHG or antibacterial soaps.  If your skin becomes reddened/irritated stop using the CHG and inform your nurse when you arrive at Short Stay. Do not shave (including legs and underarms) for at least 48 hours prior to the first CHG shower.  You may shave your face/neck. Please follow these instructions carefully:  1.  Shower with CHG Soap the night before surgery and the  morning of Surgery.  2.  If you choose to wash your hair, wash your hair first as usual with your  normal  shampoo.  3.  After you shampoo, rinse your hair and body thoroughly to remove the  shampoo.                           4.  Use CHG as you would any other liquid soap.  You can apply chg directly  to the skin and wash                        Gently with a scrungie or clean washcloth.  5.  Apply the CHG Soap to your body ONLY FROM THE NECK DOWN.   Do not use on face/ open                           Wound or open sores. Avoid contact with eyes, ears mouth and genitals (private parts).                       Wash face,  Genitals (private parts) with your normal soap.             6.  Wash thoroughly, paying special attention to the area where your surgery  will be performed.  7.  Thoroughly rinse your body with warm water from the neck down.  8.  DO NOT shower/wash with your normal soap after using and rinsing off  the CHG Soap.                9.  Pat yourself dry with a clean towel.            10.  Wear clean pajamas.            11.  Place clean sheets on your bed the night of your first shower and do not  sleep with pets. Day of Surgery : Do not apply any lotions/deodorants the morning of surgery.  Please wear clean clothes to the hospital/surgery center.  FAILURE TO FOLLOW THESE INSTRUCTIONS MAY RESULT IN THE CANCELLATION OF YOUR SURGERY PATIENT SIGNATURE_________________________________  NURSE SIGNATURE__________________________________  ________________________________________________________________________ [facility]- Preparing for Total Shoulder Arthroplasty    Before surgery, you can play an important role. Because skin is not sterile, your skin needs to be as free of germs as possible. You can reduce the number of germs on your skin by using the following products. Benzoyl Peroxide Gel Reduces the number of germs present on the skin Applied twice a day to shoulder area starting two days before surgery    ==================================================================  Please follow these instructions carefully:  BENZOYL PEROXIDE 5% GEL  Please do not use if you have an  allergy to benzoyl peroxide.   If your skin becomes reddened/irritated stop using the benzoyl peroxide.  Starting two days before surgery,  apply as follows: Apply benzoyl peroxide in the morning and at night. Apply after taking a shower. If you are not taking a shower clean entire shoulder front, back, and side along with the armpit with a clean wet washcloth.  Place a quarter-sized dollop on your shoulder and rub in thoroughly, making sure to cover the front, back, and side of your shoulder, along with the armpit.   2 days before ____ AM   ____ PM              1 day before ____ AM   ____ PM                         Do this twice a day for two days.  (Last application is the night before surgery, AFTER using the CHG soap as described below).  Do NOT apply benzoyl peroxide gel on the day of surgery.   Incentive Spirometer  An incentive spirometer is a tool that can help keep your lungs clear and active. This tool measures how well you are filling your lungs with each breath. Taking long deep breaths may help reverse or decrease the chance of developing breathing (pulmonary) problems (especially infection) following: A long period of time when you are unable to move or be active. BEFORE THE PROCEDURE  If the spirometer includes an indicator to show your best effort, your nurse or respiratory therapist will set it to a desired goal. If possible, sit up straight or lean slightly forward. Try not to slouch. Hold the incentive spirometer in an upright position. INSTRUCTIONS FOR USE  Sit on the edge of your bed if possible, or sit up as far as you can in bed or on a chair. Hold the incentive spirometer in an upright position. Breathe out normally. Place the mouthpiece in your mouth and seal your lips tightly around it. Breathe in slowly and as deeply as possible, raising the piston or the ball toward the top of the column. Hold your breath for 3-5 seconds or for as long as possible. Allow the piston or ball to fall to the bottom of the column. Remove the mouthpiece from your mouth and breathe out normally. Rest for a few seconds and  repeat Steps 1 through 7 at least 10 times every 1-2 hours when you are awake. Take your time and take a few normal breaths between deep breaths. The spirometer may include an indicator to show your best effort. Use the indicator as a goal to work toward during each repetition. After each set of 10 deep breaths, practice coughing to be sure your lungs are clear. If you have an incision (the cut made at the time of surgery), support your incision when coughing by placing a pillow or rolled up towels firmly against it. Once you are able to get out of bed, walk around indoors and cough well. You may stop using the incentive spirometer when instructed by your caregiver.  RISKS AND COMPLICATIONS Take your time so you do not get dizzy or light-headed. If you are in pain, you may need to take or ask for pain medication before doing incentive spirometry. It is harder to take a deep breath if you are having pain. AFTER USE Rest and breathe slowly and easily. It can be helpful to keep track of a log of  your progress. Your caregiver can provide you with a simple table to help with this. If you are using the spirometer at home, follow these instructions: [place] IF:  You are having difficultly using the spirometer. You have trouble using the spirometer as often as instructed. Your pain medication is not giving enough relief while using the spirometer. You develop fever of 100.5 F (38.1 C) or higher. SEEK IMMEDIATE MEDICAL CARE IF:  You cough up bloody sputum that had not been present before. You develop fever of 102 F (38.9 C) or greater. You develop worsening pain at or near the incision site. MAKE SURE YOU:  Understand these instructions. Will watch your condition. Will get help right away if you are not doing well or get worse. Document Released: 06/29/2006 Document Revised: 05/11/2011 Document Reviewed: 08/30/2006 [facility] Patient Information 2014 [place].   ________________________________________________________________________

## 2021-04-16 ENCOUNTER — Encounter (HOSPITAL_COMMUNITY)
Admission: RE | Admit: 2021-04-16 | Discharge: 2021-04-16 | Disposition: A | Discharge status: Home / Self Care | Payer: Medicare Other | Source: Ambulatory Visit | Attending: Orthopedic Surgery | Admitting: Orthopedic Surgery

## 2021-04-16 ENCOUNTER — Other Ambulatory Visit: Payer: Self-pay

## 2021-04-16 ENCOUNTER — Encounter (HOSPITAL_COMMUNITY): Payer: Self-pay

## 2021-04-16 VITALS — BP 167/89 | HR 71 | Temp 98.6°F | Ht 66.0 in | Wt 167.0 lb

## 2021-04-16 DIAGNOSIS — Z01812 Encounter for preprocedural laboratory examination: Secondary | ICD-10-CM | POA: Diagnosis present

## 2021-04-16 DIAGNOSIS — Z85850 Personal history of malignant neoplasm of thyroid: Secondary | ICD-10-CM | POA: Insufficient documentation

## 2021-04-16 DIAGNOSIS — Z7982 Long term (current) use of aspirin: Secondary | ICD-10-CM | POA: Diagnosis not present

## 2021-04-16 DIAGNOSIS — M19011 Primary osteoarthritis, right shoulder: Secondary | ICD-10-CM | POA: Insufficient documentation

## 2021-04-16 DIAGNOSIS — Z7902 Long term (current) use of antithrombotics/antiplatelets: Secondary | ICD-10-CM | POA: Diagnosis not present

## 2021-04-16 DIAGNOSIS — Z01818 Encounter for other preprocedural examination: Secondary | ICD-10-CM

## 2021-04-16 DIAGNOSIS — I2510 Atherosclerotic heart disease of native coronary artery without angina pectoris: Secondary | ICD-10-CM | POA: Insufficient documentation

## 2021-04-16 DIAGNOSIS — Z955 Presence of coronary angioplasty implant and graft: Secondary | ICD-10-CM | POA: Diagnosis not present

## 2021-04-16 DIAGNOSIS — E890 Postprocedural hypothyroidism: Secondary | ICD-10-CM | POA: Insufficient documentation

## 2021-04-16 DIAGNOSIS — E119 Type 2 diabetes mellitus without complications: Secondary | ICD-10-CM | POA: Insufficient documentation

## 2021-04-16 DIAGNOSIS — I119 Hypertensive heart disease without heart failure: Secondary | ICD-10-CM | POA: Diagnosis not present

## 2021-04-16 LAB — SURGICAL PCR SCREEN
MRSA, PCR: NEGATIVE
Staphylococcus aureus: NEGATIVE

## 2021-04-16 LAB — BASIC METABOLIC PANEL
Anion gap: 7 (ref 5–15)
BUN: 11 mg/dL (ref 8–23)
CO2: 26 mmol/L (ref 22–32)
Calcium: 9.6 mg/dL (ref 8.9–10.3)
Chloride: 103 mmol/L (ref 98–111)
Creatinine, Ser: 0.64 mg/dL (ref 0.44–1.00)
GFR, Estimated: >60 mL/min (ref >60)
Glucose, Bld: 112 mg/dL — ABNORMAL HIGH (ref 70–99)
Potassium: 4.6 mmol/L (ref 3.5–5.1)
Sodium: 136 mmol/L (ref 135–145)

## 2021-04-16 LAB — HEMOGLOBIN A1C
Hgb A1c MFr Bld: 6.5 % — ABNORMAL HIGH (ref 4.8–5.6)
Mean Plasma Glucose: 139.85 mg/dL

## 2021-04-16 LAB — CBC
HCT: 35.1 % — ABNORMAL LOW (ref 36.0–46.0)
Hemoglobin: 11.9 g/dL — ABNORMAL LOW (ref 12.0–15.0)
MCH: 29.1 pg (ref 26.0–34.0)
MCHC: 33.9 g/dL (ref 30.0–36.0)
MCV: 85.8 fL (ref 80.0–100.0)
Platelets: 275 10*3/uL (ref 150–400)
RBC: 4.09 MIL/uL (ref 3.87–5.11)
RDW: 13 % (ref 11.5–15.5)
WBC: 6.7 10*3/uL (ref 4.0–10.5)
nRBC: 0 % (ref 0.0–0.2)

## 2021-04-16 LAB — GLUCOSE, CAPILLARY: Glucose-Capillary: 116 mg/dL — ABNORMAL HIGH (ref 70–99)

## 2021-04-16 NOTE — Progress Notes (Addendum)
COVID Vaccine Completed: Yes Date COVID Vaccine completed: 2021 x 2 COVID vaccine manufacturer:  Moderna    COVID Test: N/A Bowel prep reminder: N/A  PCP - DO: Michell Heinrich Cardiologist - Dr. Raechel Chute: Angelina Sheriff.: Clearance: 04/02/21: Chart.  Chest x-ray -  EKG - 10/28/20: Chart Stress Test -  ECHO - 06/07/20 Cardiac Cath -  Pacemaker/ICD device last checked:  Sleep Study -  CPAP -   Fasting Blood Sugar -  Checks Blood Sugar _____ times a day  Blood Thinner Instructions: Plavix will be on held 5 days prior surgery. Aspirin Instructions: Last Dose:  Anesthesia review: Hx: MI,CAD,DIA,HTN  Patient denies shortness of breath, fever, cough and chest pain at PAT appointment   Patient verbalized understanding of instructions that were given to them at the PAT appointment. Patient was also instructed that they will need to review over the PAT instructions again at home before surgery.

## 2021-04-17 NOTE — Anesthesia Preprocedure Evaluation (Addendum)
Anesthesia Evaluation  Patient identified by MRN, date of birth, ID band Patient awake    Reviewed: Allergy & Precautions, NPO status , Patient's Chart, lab work & pertinent test results  History of Anesthesia Complications (+) PONV and history of anesthetic complications  Airway Mallampati: II       Dental no notable dental hx.    Pulmonary former smoker,    Pulmonary exam normal        Cardiovascular hypertension, Pt. on home beta blockers + CAD, + Past MI and + Cardiac Stents  Normal cardiovascular exam     Neuro/Psych negative psych ROS   GI/Hepatic GERD  Medicated,  Endo/Other  diabetes, Type 2, Oral Hypoglycemic AgentsHypothyroidism   Renal/GU      Musculoskeletal  (+) Arthritis , Osteoarthritis,    Abdominal Normal abdominal exam  (+)   Peds  Hematology  (+) Blood dyscrasia, anemia ,   Anesthesia Other Findings CV: R/L cardiac cath 07/02/2020 (from Dr. Lona Millard notes: - RA 5, RV 31/5, PA 31/8/18, mWP 8, LVEDP 14, EF greater than 80, near cavity obliteration in systole.  No AVG.   - Left main normal - LAD stent in mid LAD 50%, native mid LAD after stent 75%. Status post DES to mid LAD - LCx <25 - RCA dominant 15% proximal  - Normal renals.     Echo 06/11/20 (from Dr. Lona Millard notes:  - EF 60-65% with mild LVH, DHV, normal diastolic function, normal LV FP, normal RV size and function with normal RAP, normal RSVP, slightly dilated LA, structurally normal valves, trace MR, TR, AI  Cardiac event monitor June 2021 (from Dr. Lona Millard notes:  - Unremarkable Zio patch monitoring with symptoms of palpitations reported by the patient most correlating with episodes of increased heart rate including sinus tachycardia and very short SVT.   - Overall burden of SVT very low and very short up to 15 seconds only.   - Negligible ventricular ectopy burden   Reproductive/Obstetrics                                                             Anesthesia Evaluation  Patient identified by MRN, date of birth, ID band Patient awake    Reviewed: Allergy & Precautions, NPO status , Patient's Chart, lab work & pertinent test results, reviewed documented beta blocker date and time   History of Anesthesia Complications (+) PONV and history of anesthetic complications  Airway Mallampati: II  TM Distance: >3 FB Neck ROM: Full    Dental no notable dental hx. (+) Teeth Intact, Dental Advisory Given   Pulmonary neg pulmonary ROS, former smoker,    Pulmonary exam normal breath sounds clear to auscultation       Cardiovascular hypertension, Pt. on home beta blockers and Pt. on medications + CAD, + Past MI (2004) and + Cardiac Stents (2006)  Normal cardiovascular exam Rhythm:Regular Rate:Normal     Neuro/Psych negative neurological ROS  negative psych ROS   GI/Hepatic Neg liver ROS, GERD  ,  Endo/Other  diabetes, Type 2, Oral Hypoglycemic AgentsHypothyroidism   Renal/GU negative Renal ROS  negative genitourinary   Musculoskeletal  (+) Arthritis , Osteoarthritis,    Abdominal   Peds  Hematology negative hematology ROS (+)   Anesthesia Other Findings On plavix, last dose 02/15/19  AM    Reproductive/Obstetrics                         Anesthesia Physical Anesthesia Plan  ASA: III  Anesthesia Plan: Regional and Spinal   Post-op Pain Management:    Induction: Intravenous  PONV Risk Score and Plan: 3 and Ondansetron, Dexamethasone and Treatment may vary due to age or medical condition  Airway Management Planned: Natural Airway  Additional Equipment:   Intra-op Plan:   Post-operative Plan:   Informed Consent: I have reviewed the patients History and Physical, chart, labs and discussed the procedure including the risks, benefits and alternatives for the proposed anesthesia with the patient or authorized representative who has  indicated his/her understanding and acceptance.     Dental advisory given  Plan Discussed with: CRNA  Anesthesia Plan Comments:       Anesthesia Quick Evaluation  Anesthesia Physical Anesthesia Plan  ASA: 3  Anesthesia Plan: General   Post-op Pain Management: Regional block*   Induction:   PONV Risk Score and Plan: 4 or greater and Ondansetron, Midazolam and Treatment may vary due to age or medical condition  Airway Management Planned: Oral ETT  Additional Equipment: None  Intra-op Plan:   Post-operative Plan: Extubation in OR  Informed Consent: I have reviewed the patients History and Physical, chart, labs and discussed the procedure including the risks, benefits and alternatives for the proposed anesthesia with the patient or authorized representative who has indicated his/her understanding and acceptance.     Dental advisory given  Plan Discussed with: CRNA  Anesthesia Plan Comments: (See APP note by Durel Salts, FNP. S/p DES to LAD 06/2020; has cardiac clearance for OR and to hold plavix. )       Anesthesia Quick Evaluation

## 2021-04-17 NOTE — Progress Notes (Signed)
Anesthesia Chart Review:   Case: [id number] Date/Time: 04/24/21 1215   Procedure: TOTAL SHOULDER ARTHROPLASTY (Right: Shoulder)   Anesthesia type: General   Pre-op diagnosis: Right shoulder osteoarthritis   [facility]   Surgeons: Justice Britain, MD       DISCUSSION: Pt is 71 years old with hx CAD (s/p PCI to LAD 2006; s/p DES to LAD 07/02/20), HTN, DM, hypothyroidism (s/p thyroidectomy 01/2015 due to CA)  Pt to hold plavix 5 days before surgery. Pt to continue ASA perioperatively    VS: BP (!) 167/89    Pulse 71    Temp 37 C (Oral)    Ht 5\' 6"  (1.676 m)    Wt 75.8 kg    SpO2 99%    BMI 26.95 kg/m   PROVIDERS: - PCP is Michell Heinrich, DO - Cardiologist is Raechel Chute, MD in [place], [place] who cleared pt for surgery (note on paper chart) and gave ok to hold plavix as long as ASA is continued   LABS: Labs reviewed: Acceptable for surgery. (all labs ordered are listed, but only abnormal results are displayed)  Labs Reviewed  BASIC METABOLIC PANEL - Abnormal; Notable for the following components:      Result Value   Glucose, Bld 112 (*)    All other components within normal limits  HEMOGLOBIN A1C - Abnormal; Notable for the following components:   Hgb A1c MFr Bld 6.5 (*)    All other components within normal limits  CBC - Abnormal; Notable for the following components:   Hemoglobin 11.9 (*)    HCT 35.1 (*)    All other components within normal limits  GLUCOSE, CAPILLARY - Abnormal; Notable for the following components:   Glucose-Capillary 116 (*)    All other components within normal limits  SURGICAL PCR SCREEN     EKG 10/28/20 (Dr. Lona Millard office): Sinus rhythm. Negative T-waves, possible anterior ischemia (Dr. Lona Millard notes indicate T-wave changes since 2021, improved after DES to LAD 07/02/20, but did not completely resolve)   CV: R/L cardiac cath 07/02/2020 (from Dr. Lona Millard notes: - RA 5, RV 31/5, PA 31/8/18, mWP 8, LVEDP 14, EF greater than 80,  near cavity obliteration in systole.  No AVG.   - Left main normal - LAD stent in mid LAD 50%, native mid LAD after stent 75%. Status post DES to mid LAD - LCx <25 - RCA dominant 15% proximal  - Normal renals.     Echo 06/11/20 (from Dr. Lona Millard notes:  - EF 60-65% with mild LVH, DHV, normal diastolic function, normal LV FP, normal RV size and function with normal RAP, normal RSVP, slightly dilated LA, structurally normal valves, trace MR, TR, AI  Cardiac event monitor June 2021 (from Dr. Lona Millard notes:  - Unremarkable Zio patch monitoring with symptoms of palpitations reported by the patient most correlating with episodes of increased heart rate including sinus tachycardia and very short SVT.   - Overall burden of SVT very low and very short up to 15 seconds only.   - Negligible ventricular ectopy burden   Past Medical History:  Diagnosis Date   Aortic insufficiency    trace   Cancer (HCC)    thyroid right side   Coronary artery disease    Diabetes mellitus without complication (HCC)    GERD (gastroesophageal reflux disease)    History of bronchitis 01/2018   Hyperlipidemia    Hypertension    Hypertensive heart disease    Hypothyroidism  Mitral insufficiency    trace   Moderate concentric left ventricular hypertrophy    Myocardial infarction ([place]) 07/01/2002   Osteoarthritis of left knee 02/02/2017   Osteoarthritis of left knee    Pneumonia    history of   PONV (postoperative nausea and vomiting)    Tricuspid insufficiency    trace    Past Surgical History:  Procedure Laterality Date   COLONOSCOPY     HEEL SPUR SURGERY     KNEE ARTHROPLASTY Left 02/25/2017   Procedure: LEFT TOTAL KNEE ARTHROPLASTY WITH COMPUTER NAVIGATION;  Surgeon: Rod Can, MD;  Location[facility];  Service: Orthopedics;  Laterality: Left;  Needs RNFA   KNEE ARTHROPLASTY Right 03/23/2018   Procedure: COMPUTER ASSISTED TOTAL KNEE ARTHROPLASTY;  Surgeon: Rod Can, MD;  [facility];  Service: Orthopedics;  Laterality: Right;   right wrist surgery     thumb fracture surgery     THYROIDECTOMY, PARTIAL     TONSILLECTOMY AND ADENOIDECTOMY     TUMOR REMOVAL     lieomyoma   UPPER GI ENDOSCOPY     VAGINAL HYSTERECTOMY      MEDICATIONS:  Calcium Carb-Cholecalciferol (CALCIUM 600/VITAMIN D3 PO)   Cholecalciferol (VITAMIN D3) 125 MCG (5000 UT) CAPS   clopidogrel (PLAVIX) 75 MG tablet   esomeprazole (NEXIUM) 20 MG capsule   isosorbide mononitrate (IMDUR) 30 MG 24 hr tablet   levothyroxine (SYNTHROID, LEVOTHROID) 100 MCG tablet   metFORMIN (GLUCOPHAGE) 500 MG tablet   metoprolol tartrate (LOPRESSOR) 50 MG tablet   Multiple Vitamins-Minerals (MULTIVITAMIN PO)   nabumetone (RELAFEN) 500 MG tablet   rosuvastatin (CRESTOR) 5 MG tablet   No current facility-administered medications for this encounter.   - Pt to hold plavix 5 days before surgery and continue ASA perioperatively    If no changes, I anticipate pt can proceed with surgery as scheduled.   Willeen Cass, PhD, FNP-BC [facility]/Anesthesiology Phone: [phone] 04/17/2021 12:50 PM

## 2021-04-24 ENCOUNTER — Encounter (HOSPITAL_COMMUNITY): Payer: Self-pay | Admitting: Orthopedic Surgery

## 2021-04-24 ENCOUNTER — Encounter (HOSPITAL_COMMUNITY)
Admission: RE | Disposition: A | Discharge status: Home / Self Care | Payer: Self-pay | Source: Home / Self Care | Attending: Orthopedic Surgery

## 2021-04-24 ENCOUNTER — Ambulatory Visit (HOSPITAL_COMMUNITY)
Admission: RE | Admit: 2021-04-24 | Discharge: 2021-04-24 | Disposition: A | Discharge status: Home / Self Care | Payer: Medicare Other | Attending: Orthopedic Surgery | Admitting: Orthopedic Surgery

## 2021-04-24 ENCOUNTER — Other Ambulatory Visit: Payer: Self-pay

## 2021-04-24 ENCOUNTER — Ambulatory Visit (HOSPITAL_COMMUNITY): Payer: Medicare Other | Admitting: Emergency Medicine

## 2021-04-24 ENCOUNTER — Ambulatory Visit (HOSPITAL_BASED_OUTPATIENT_CLINIC_OR_DEPARTMENT_OTHER): Payer: Medicare Other | Admitting: Anesthesiology

## 2021-04-24 DIAGNOSIS — K219 Gastro-esophageal reflux disease without esophagitis: Secondary | ICD-10-CM | POA: Insufficient documentation

## 2021-04-24 DIAGNOSIS — E039 Hypothyroidism, unspecified: Secondary | ICD-10-CM | POA: Insufficient documentation

## 2021-04-24 DIAGNOSIS — M19011 Primary osteoarthritis, right shoulder: Secondary | ICD-10-CM | POA: Insufficient documentation

## 2021-04-24 DIAGNOSIS — I2510 Atherosclerotic heart disease of native coronary artery without angina pectoris: Secondary | ICD-10-CM

## 2021-04-24 DIAGNOSIS — I252 Old myocardial infarction: Secondary | ICD-10-CM | POA: Diagnosis not present

## 2021-04-24 DIAGNOSIS — Z7984 Long term (current) use of oral hypoglycemic drugs: Secondary | ICD-10-CM | POA: Insufficient documentation

## 2021-04-24 DIAGNOSIS — Z87891 Personal history of nicotine dependence: Secondary | ICD-10-CM | POA: Diagnosis not present

## 2021-04-24 DIAGNOSIS — I119 Hypertensive heart disease without heart failure: Secondary | ICD-10-CM | POA: Diagnosis not present

## 2021-04-24 DIAGNOSIS — I10 Essential (primary) hypertension: Secondary | ICD-10-CM | POA: Diagnosis not present

## 2021-04-24 DIAGNOSIS — Z79899 Other long term (current) drug therapy: Secondary | ICD-10-CM | POA: Diagnosis not present

## 2021-04-24 DIAGNOSIS — E119 Type 2 diabetes mellitus without complications: Secondary | ICD-10-CM | POA: Insufficient documentation

## 2021-04-24 DIAGNOSIS — Z955 Presence of coronary angioplasty implant and graft: Secondary | ICD-10-CM | POA: Insufficient documentation

## 2021-04-24 HISTORY — PX: TOTAL SHOULDER ARTHROPLASTY: SHX126

## 2021-04-24 LAB — GLUCOSE, CAPILLARY
Glucose-Capillary: 117 mg/dL — ABNORMAL HIGH (ref 70–99)
Glucose-Capillary: 141 mg/dL — ABNORMAL HIGH (ref 70–99)

## 2021-04-24 SURGERY — ARTHROPLASTY, SHOULDER, TOTAL
Anesthesia: General | Site: Shoulder | Laterality: Right

## 2021-04-24 MED ORDER — FENTANYL CITRATE PF 50 MCG/ML IJ SOSY: 25.0000 ug | PREFILLED_SYRINGE | INTRAMUSCULAR | Status: DC | PRN

## 2021-04-24 MED ORDER — ROCURONIUM BROMIDE 10 MG/ML (PF) SYRINGE
PREFILLED_SYRINGE | INTRAVENOUS | Status: AC
  Filled 2021-04-24: qty 10

## 2021-04-24 MED ORDER — PHENYLEPHRINE HCL (PRESSORS) 10 MG/ML IV SOLN
INTRAVENOUS | Status: DC | PRN
  Administered 2021-04-24: 40 ug via INTRAVENOUS

## 2021-04-24 MED ORDER — SUGAMMADEX SODIUM 200 MG/2ML IV SOLN
INTRAVENOUS | Status: DC | PRN
  Administered 2021-04-24: 200 mg via INTRAVENOUS

## 2021-04-24 MED ORDER — FENTANYL CITRATE (PF) 100 MCG/2ML IJ SOLN
INTRAMUSCULAR | Status: AC
  Filled 2021-04-24: qty 2

## 2021-04-24 MED ORDER — ONDANSETRON HCL 4 MG/2ML IJ SOLN
INTRAMUSCULAR | Status: DC | PRN
  Administered 2021-04-24: 4 mg via INTRAVENOUS

## 2021-04-24 MED ORDER — EPHEDRINE SULFATE (PRESSORS) 50 MG/ML IJ SOLN
INTRAMUSCULAR | Status: DC | PRN
  Administered 2021-04-24 (×2): 10 mg via INTRAVENOUS
  Administered 2021-04-24: 5 mg via INTRAVENOUS

## 2021-04-24 MED ORDER — BUPIVACAINE LIPOSOME 1.3 % IJ SUSP
INTRAMUSCULAR | Status: DC | PRN
  Administered 2021-04-24 (×5): 2 mL via PERINEURAL

## 2021-04-24 MED ORDER — SUCCINYLCHOLINE CHLORIDE 200 MG/10ML IV SOSY
PREFILLED_SYRINGE | INTRAVENOUS | Status: AC
  Filled 2021-04-24: qty 10

## 2021-04-24 MED ORDER — CYCLOBENZAPRINE HCL 10 MG PO TABS: 10.0000 mg | ORAL_TABLET | Freq: Three times a day (TID) | ORAL | 1 refills | Status: AC | PRN

## 2021-04-24 MED ORDER — MIDAZOLAM HCL 2 MG/2ML IJ SOLN
1.0000 mg | INTRAMUSCULAR | Status: DC
  Administered 2021-04-24: 2 mg via INTRAVENOUS
  Filled 2021-04-24: qty 2

## 2021-04-24 MED ORDER — LACTATED RINGERS IV BOLUS
500.0000 mL | Freq: Once | INTRAVENOUS | Status: AC
  Administered 2021-04-24: 500 mL via INTRAVENOUS

## 2021-04-24 MED ORDER — DEXAMETHASONE SODIUM PHOSPHATE 10 MG/ML IJ SOLN
INTRAMUSCULAR | Status: AC
  Filled 2021-04-24: qty 1

## 2021-04-24 MED ORDER — IBUPROFEN 200 MG PO TABS: 200.0000 mg | ORAL_TABLET | Freq: Four times a day (QID) | ORAL | Status: DC | PRN

## 2021-04-24 MED ORDER — PROPOFOL 10 MG/ML IV BOLUS
INTRAVENOUS | Status: AC
  Filled 2021-04-24: qty 20

## 2021-04-24 MED ORDER — ONDANSETRON HCL 4 MG/2ML IJ SOLN
INTRAMUSCULAR | Status: AC
  Filled 2021-04-24: qty 2

## 2021-04-24 MED ORDER — CHLORHEXIDINE GLUCONATE 0.12 % MT SOLN
15.0000 mL | Freq: Once | OROMUCOSAL | Status: AC
  Administered 2021-04-24: 15 mL via OROMUCOSAL

## 2021-04-24 MED ORDER — IBUPROFEN 100 MG/5ML PO SUSP: 200.0000 mg | Freq: Four times a day (QID) | ORAL | Status: DC | PRN

## 2021-04-24 MED ORDER — HYDROMORPHONE HCL 2 MG PO TABS: 2.0000 mg | ORAL_TABLET | ORAL | 0 refills | Status: AC | PRN

## 2021-04-24 MED ORDER — MEPERIDINE HCL 50 MG/ML IJ SOLN: 6.2500 mg | INTRAMUSCULAR | Status: DC | PRN

## 2021-04-24 MED ORDER — ORAL CARE MOUTH RINSE: 15.0000 mL | Freq: Once | OROMUCOSAL | Status: AC

## 2021-04-24 MED ORDER — LACTATED RINGERS IV BOLUS: 250.0000 mL | Freq: Once | INTRAVENOUS | Status: DC

## 2021-04-24 MED ORDER — FENTANYL CITRATE (PF) 100 MCG/2ML IJ SOLN
INTRAMUSCULAR | Status: DC | PRN
  Administered 2021-04-24: 100 ug via INTRAVENOUS

## 2021-04-24 MED ORDER — DEXAMETHASONE SODIUM PHOSPHATE 10 MG/ML IJ SOLN
INTRAMUSCULAR | Status: DC | PRN
  Administered 2021-04-24: 10 mg via INTRAVENOUS

## 2021-04-24 MED ORDER — ONDANSETRON HCL 4 MG/2ML IJ SOLN: 4.0000 mg | Freq: Once | INTRAMUSCULAR | Status: DC | PRN

## 2021-04-24 MED ORDER — ONDANSETRON HCL 4 MG PO TABS: 4.0000 mg | ORAL_TABLET | Freq: Three times a day (TID) | ORAL | 0 refills | Status: AC | PRN

## 2021-04-24 MED ORDER — TRANEXAMIC ACID-NACL 1000-0.7 MG/100ML-% IV SOLN
1000.0000 mg | INTRAVENOUS | Status: AC
  Administered 2021-04-24: 1000 mg via INTRAVENOUS
  Filled 2021-04-24: qty 100

## 2021-04-24 MED ORDER — ROCURONIUM BROMIDE 100 MG/10ML IV SOLN
INTRAVENOUS | Status: DC | PRN
  Administered 2021-04-24: 40 mg via INTRAVENOUS
  Administered 2021-04-24: 10 mg via INTRAVENOUS

## 2021-04-24 MED ORDER — LACTATED RINGERS IV SOLN: INTRAVENOUS | Status: DC

## 2021-04-24 MED ORDER — PROPOFOL 10 MG/ML IV BOLUS
INTRAVENOUS | Status: DC | PRN
  Administered 2021-04-24: 120 mg via INTRAVENOUS

## 2021-04-24 MED ORDER — LIDOCAINE HCL (CARDIAC) PF 100 MG/5ML IV SOSY
PREFILLED_SYRINGE | INTRAVENOUS | Status: DC | PRN
  Administered 2021-04-24: 100 mg via INTRAVENOUS

## 2021-04-24 MED ORDER — STERILE WATER FOR IRRIGATION IR SOLN
Status: DC | PRN
  Administered 2021-04-24: 2000 mL

## 2021-04-24 MED ORDER — CEFAZOLIN SODIUM-DEXTROSE 2-4 GM/100ML-% IV SOLN
2.0000 g | INTRAVENOUS | Status: AC
  Administered 2021-04-24: 2 g via INTRAVENOUS
  Filled 2021-04-24: qty 100

## 2021-04-24 MED ORDER — VANCOMYCIN HCL 1000 MG IV SOLR
INTRAVENOUS | Status: AC
  Filled 2021-04-24: qty 20

## 2021-04-24 MED ORDER — 0.9 % SODIUM CHLORIDE (POUR BTL) OPTIME
TOPICAL | Status: DC | PRN
Start: 2021-04-24 — End: 2021-04-24
  Administered 2021-04-24: 1000 mL

## 2021-04-24 MED ORDER — SUCCINYLCHOLINE CHLORIDE 200 MG/10ML IV SOSY
PREFILLED_SYRINGE | INTRAVENOUS | Status: DC | PRN
  Administered 2021-04-24: 100 mg via INTRAVENOUS

## 2021-04-24 MED ORDER — BUPIVACAINE-EPINEPHRINE (PF) 0.5% -1:200000 IJ SOLN
INTRAMUSCULAR | Status: DC | PRN
  Administered 2021-04-24 (×5): 3 mL via PERINEURAL

## 2021-04-24 MED ORDER — LIDOCAINE HCL (PF) 2 % IJ SOLN
INTRAMUSCULAR | Status: AC
  Filled 2021-04-24: qty 5

## 2021-04-24 MED ORDER — FENTANYL CITRATE PF 50 MCG/ML IJ SOSY
50.0000 ug | PREFILLED_SYRINGE | INTRAMUSCULAR | Status: DC
  Administered 2021-04-24: 100 ug via INTRAVENOUS
  Filled 2021-04-24: qty 2

## 2021-04-24 SURGICAL SUPPLY — 70 items
BAG COUNTER SPONGE SURGICOUNT (BAG) ×1 IMPLANT
BAG ZIPLOCK 12X15 (MISCELLANEOUS) ×2 IMPLANT
BIT DRILL 2.0X128 (BIT) IMPLANT
BLADE SAW SGTL 83.5X18.5 (BLADE) ×2 IMPLANT
BODY TRUNION ECLIPSE 43 SL (Shoulder) ×1 IMPLANT
CEMENT BONE DEPUY (Cement) ×2 IMPLANT
COOLER ICEMAN CLASSIC (MISCELLANEOUS) ×2 IMPLANT
COVER BACK TABLE 60X90IN (DRAPES) ×2 IMPLANT
COVER SURGICAL LIGHT HANDLE (MISCELLANEOUS) ×2 IMPLANT
DERMABOND ADVANCED (GAUZE/BANDAGES/DRESSINGS) ×1
DERMABOND ADVANCED .7 DNX12 (GAUZE/BANDAGES/DRESSINGS) ×1 IMPLANT
DRAPE ORTHO SPLIT 77X108 STRL (DRAPES) ×2
DRAPE SHEET LG 3/4 BI-LAMINATE (DRAPES) ×2 IMPLANT
DRAPE SURG 17X11 SM STRL (DRAPES) ×2 IMPLANT
DRAPE SURG ORHT 6 SPLT 77X108 (DRAPES) ×2 IMPLANT
DRAPE TOP 10253 STERILE (DRAPES) ×2 IMPLANT
DRAPE U-SHAPE 47X51 STRL (DRAPES) ×2 IMPLANT
DRSG AQUACEL AG ADV 3.5X 6 (GAUZE/BANDAGES/DRESSINGS) ×1 IMPLANT
DRSG AQUACEL AG ADV 3.5X10 (GAUZE/BANDAGES/DRESSINGS) ×2 IMPLANT
DRSG TEGADERM 8X12 (GAUZE/BANDAGES/DRESSINGS) ×2 IMPLANT
DURAPREP 26ML APPLICATOR (WOUND CARE) ×2 IMPLANT
ELECT BLADE TIP CTD 4 INCH (ELECTRODE) ×2 IMPLANT
ELECT PENCIL ROCKER SW 15FT (MISCELLANEOUS) ×2 IMPLANT
ELECT REM PT RETURN 15FT ADLT (MISCELLANEOUS) ×2 IMPLANT
FACESHIELD WRAPAROUND (MASK) ×8 IMPLANT
FACESHIELD WRAPAROUND OR TEAM (MASK) ×4 IMPLANT
GLENOID WITH CLEAT MEDIUM (Shoulder) ×1 IMPLANT
GLOVE SRG 8 PF TXTR STRL LF DI (GLOVE) ×1 IMPLANT
GLOVE SURG ENC MOIS LTX SZ7 (GLOVE) ×2 IMPLANT
GLOVE SURG ENC MOIS LTX SZ7.5 (GLOVE) ×2 IMPLANT
GLOVE SURG UNDER POLY LF SZ7 (GLOVE) ×2 IMPLANT
GLOVE SURG UNDER POLY LF SZ8 (GLOVE) ×1
GOWN STRL REUS W/TWL LRG LVL3 (GOWN DISPOSABLE) ×4 IMPLANT
HEAD HUMERAL ECLIPSE 43/16 (Shoulder) ×1 IMPLANT
IMPL ECLIPSE SPEEDCAP (Shoulder) IMPLANT
IMPLANT ECLIPSE SPEEDCAP (Shoulder) ×2 IMPLANT
KIT BASIN OR (CUSTOM PROCEDURE TRAY) ×2 IMPLANT
KIT TURNOVER KIT A (KITS) ×1 IMPLANT
MANIFOLD NEPTUNE II (INSTRUMENTS) ×2 IMPLANT
NDL TAPERED W/ NITINOL LOOP (MISCELLANEOUS) IMPLANT
NEEDLE TAPERED W/ NITINOL LOOP (MISCELLANEOUS) ×2 IMPLANT
NS IRRIG 1000ML POUR BTL (IV SOLUTION) ×2 IMPLANT
PACK SHOULDER (CUSTOM PROCEDURE TRAY) ×2 IMPLANT
PAD COLD SHLDR WRAP-ON (PAD) ×2 IMPLANT
PIN NITINOL TARGETER 2.8 (PIN) IMPLANT
PIN SET MODULAR GLENOID SYSTEM (PIN) ×1 IMPLANT
PROTECTOR NERVE ULNAR (MISCELLANEOUS) ×2 IMPLANT
RESTRAINT HEAD UNIVERSAL NS (MISCELLANEOUS) ×2 IMPLANT
SCREW MED ECLIPSE 35 (Screw) ×1 IMPLANT
SIZER ECLIPSE CAGE SCREW (ORTHOPEDIC DISPOSABLE SUPPLIES) ×1 IMPLANT
SLING ARM FOAM STRAP LRG (SOFTGOODS) IMPLANT
SLING ARM FOAM STRAP MED (SOFTGOODS) ×1 IMPLANT
SMARTMIX MINI TOWER (MISCELLANEOUS) ×2
SPONGE T-LAP 18X18 ~~LOC~~+RFID (SPONGE) ×2 IMPLANT
SPONGE T-LAP 4X18 ~~LOC~~+RFID (SPONGE) ×4 IMPLANT
SUCTION FRAZIER HANDLE 12FR (TUBING) ×1
SUCTION TUBE FRAZIER 12FR DISP (TUBING) ×1 IMPLANT
SUT FIBERWIRE #2 38 T-5 BLUE (SUTURE) ×4
SUT MNCRL AB 3-0 PS2 18 (SUTURE) ×2 IMPLANT
SUT MON AB 2-0 CT1 36 (SUTURE) ×2 IMPLANT
SUT VIC AB 1 CT1 36 (SUTURE) ×2 IMPLANT
SUTURE FIBERWR #2 38 T-5 BLUE (SUTURE) IMPLANT
SUTURE TAPE 1.3 40 TPR END (SUTURE) IMPLANT
SUTURETAPE 1.3 40 TPR END (SUTURE)
TOWEL OR 17X26 10 PK STRL BLUE (TOWEL DISPOSABLE) ×2 IMPLANT
TOWEL OR NON WOVEN STRL DISP B (DISPOSABLE) ×2 IMPLANT
TOWER SMARTMIX MINI (MISCELLANEOUS) IMPLANT
TUBE SUCTION HIGH CAP CLEAR NV (SUCTIONS) ×2 IMPLANT
WATER STERILE IRR 1000ML POUR (IV SOLUTION) ×4 IMPLANT
YANKAUER SUCT BULB TIP 10FT TU (MISCELLANEOUS) ×2 IMPLANT

## 2021-04-24 NOTE — Anesthesia Procedure Notes (Signed)
Procedure Name: Intubation Date/Time: 04/24/2021 12:40 PM Performed by: Aline Brochure, CRNA Pre-anesthesia Checklist: Patient identified, Patient being monitored, Timeout performed, Emergency Drugs available and Suction available Patient Re-evaluated:Patient Re-evaluated prior to induction Oxygen Delivery Method: Circle system utilized Preoxygenation: Pre-oxygenation with 100% oxygen Induction Type: IV induction Ventilation: Mask ventilation without difficulty Laryngoscope Size: Mac and 3 Grade View: Grade I Tube type: Oral Tube size: 7.0 mm Number of attempts: 1 Airway Equipment and Method: Stylet Placement Confirmation: ETT inserted through vocal cords under direct vision, positive ETCO2 and breath sounds checked- equal and bilateral Secured at: 21 cm Tube secured with: Tape Dental Injury: Teeth and Oropharynx as per pre-operative assessment

## 2021-04-24 NOTE — Op Note (Signed)
04/24/2021  2:38 PM  PATIENT:   Pamela Burns  71 y.o. female  PRE-OPERATIVE DIAGNOSIS:  Right shoulder osteoarthritis  POST-OPERATIVE DIAGNOSIS: Same  PROCEDURE: Right shoulder anatomic arthroplasty utilizing a size 43 Arthrex trunnion with a medium cage screw, 43 x 16 humeral head, medium glenoid  SURGEON:  Arlisha Patalano, Metta Clines M.D.  ASSISTANTS: Jenetta Loges, PA-C  ANESTHESIA:   General endotracheal and interscalene block with Exparel  EBL: 200 cc  SPECIMEN: None  Drains: None   PATIENT DISPOSITION:  PACU - hemodynamically stable.    PLAN OF CARE: Discharge to home after PACU  Brief history:  Pamela Burns is a 71 year old female who has had chronic and progressively increasing right shoulder pain related to severe osteoarthritis.  Due to her increasing functional limitations and failure to respond to prolonged attempts at conservative management, she is brought to the operating room at this time for planned right shoulder anatomic arthroplasty.  Preoperatively, I counseled the patient regarding treatment options and risks versus benefits thereof.  Possible surgical complications were all reviewed including potential for bleeding, infection, neurovascular injury, persistent pain, loss of motion, anesthetic complication, failure of the implant, and possible need for additional surgery. They understand and accept and agrees with our planned procedure.   Procedure in detail:  After undergoing routine preop evaluation the patient received prophylactic antibiotics in the interscalene block with Exparel was established in the holding area by the anesthesia department.  Subsequently placed spine on the operative table and underwent the smooth induction of a general endotracheal anesthesia.  Placed in the beachchair position and appropriately padded and protected.  Right shoulder girdle region sterilely prepped and draped in standard fashion.  Timeout was called.  A deltopectoral approach  to the right shoulder is made from approximately 8 cm incision.  Skin flaps were elevated dissection carried deeply and the deltopectoral interval was developed from proximal to distal with the vein taken laterally.  Conjoined tendon mobilized and retracted medially.  The long head biceps tendon was then tenodesed at the upper border of the pectoralis major tendon with proximal segment unroofed and excised.  The rotator cuff was then split from the apex of the bicipital groove to the base of the coracoid and the insertion of the subscap on the lesser tuberosity was carefully identified and an oscillating saw was then used to perform a lesser tuberosity osteotomy removing a thin wafer of bone.  The subscap was then tagged and reflected medially and capsular attachments were then divided from the anterior and inferior margins of the humeral neck.  Humeral head was delivered through the wound and extra medullary guide was then used to outline the proposed humeral head resection which was performed with an oscillating saw at approximately 30 degrees retroversion.  Surrounding soft tissues were carefully protected and then the marginal osteophytes were removed with a rondure.  The metaphysis was sized at a 43 and was prepared with the coring reamer and sized for a medium cage screw.  This point a metal cap was then placed over the cut proximal humeral surface and the glenoid was then exposed with appropriate retractors.  A circumferential labral resection was completed gaining complete visualization of the periphery of the glenoid.  A guidepin was then directed into the center of the glenoid after confirming size medium.  Glenoid was then reamed to a stable subchondral bony bed and then preparation completed with the central drill followed by the placement of the superior and inferior peg and slot respectively.  The glenoid was then broached and proximal with excellent fit and fixation.  At this point the glenoid was  then carefully cleaned and dried.  Cement was then mixed and inserted into the superior and inferior peg and slot respectively and bone graft was impacted around the central peg and the glenoid was then impacted with excellent purchase and fixation.  We then returned our attention back to the humeral metaphysis where the bone quality was solid and we proceeded with our stemless implant.  A 43 trunnion was then impacted into place after suture had been placed through the eyelid on the collar of the trunnion for later repair of the subscap.  Our central cage screw was packed with bone and then inserted achieving excellent purchase and fixation.  We then performed a series of trial reductions and ultimately felt that the 43 x 16 head gave [place] the best motion stability and soft tissue balance with approximately 50% translation of the humeral head of the glenoid.  The trial humeral head was then removed.  We placed 2 Medial Row suture anchors for later repair of the subscapularis.  The trunnion was then meticulously cleaned and dried and the final 43 x 16 head was impacted.  Final reduction was performed and this showed again excellent motion stability and soft tissue balance much to our satisfaction.  We then confirmed good elasticity of the subscapularis and we passed our medial row anchors up to the bone tendon junction of the LTO after having placed an apex suture at the superior aspect of the subscap for proper positioning of the repair.  Once the medial row anchors were passed they were then passed into 2 lateral anchors in an alternating fashion allowing a double row repair of the LTO with excellent reapposition good stability much to our satisfaction.  The rotator interval was also repaired with figure-of-eight suture tape sutures.  Upon completion the arm easily achieved 45 degrees of external Tatian without excessive tension on the subscap repair.  The wound was then copiously irrigated.  Final hemostasis was  obtained.  Vancomycin powder was then spread liberally throughout the deep soft tissue planes.  The deltopectoral interval was reapproximated with a series of figure-of-eight and 1 Vicryl sutures.  2-0 Monocryl used to close the subcu layer and intracuticular 3-0 Monocryl for the skin followed by Dermabond and Aquacel dressing.  The right arm was placed into a sling.  The patient was then awakened, extubated, and taken to the recovery room in stable condition.  Jenetta Loges, PA-C was utilized as an Environmental consultant throughout this case, essential for help with positioning the patient, positioning extremity, tissue manipulation, implantation of the prosthesis, suture management, wound closure, and intraoperative decision-making.  Marin Shutter MD   Contact # [phone]

## 2021-04-24 NOTE — Progress Notes (Signed)
Assisted Dr. Hatchett with left, ultrasound guided, supraclavicular block. Side rails up, monitors on throughout procedure. See vital signs in flow sheet. Tolerated Procedure well. 

## 2021-04-24 NOTE — Anesthesia Postprocedure Evaluation (Signed)
Anesthesia Post Note  Patient: Pamela Burns  Procedure(s) Performed: TOTAL SHOULDER ARTHROPLASTY (Right: Shoulder)     Patient location during evaluation: PACU Anesthesia Type: General Level of consciousness: awake Pain management: pain level controlled Vital Signs Assessment: post-procedure vital signs reviewed and stable Respiratory status: spontaneous breathing Cardiovascular status: stable Postop Assessment: no apparent nausea or vomiting Anesthetic complications: no   No notable events documented.  Last Vitals:  Vitals:   04/24/21 1445 04/24/21 1500  BP: (!) 147/96 (!) 167/92  Pulse: 88 95  Resp: 14 20  Temp:    SpO2: 99% 94%    Last Pain:  Vitals:   04/24/21 1500  TempSrc:   PainSc: 0-No pain                 [name]

## 2021-04-24 NOTE — Anesthesia Procedure Notes (Signed)
Anesthesia Regional Block: Interscalene brachial plexus block   Pre-Anesthetic Checklist: , timeout performed,  Correct Patient, Correct Site, Correct Laterality,  Correct Procedure, Correct Position, site marked,  Risks and benefits discussed,  Surgical consent,  Pre-op evaluation,  At surgeon's request and post-op pain management  Laterality: Right and Upper  Prep: chloraprep       Needles:  Injection technique: Single-shot  Needle Type: Echogenic Stimulator Needle     Needle Length: 9cm  Needle Gauge: 20   Needle insertion depth: 1 cm   Additional Needles:   Procedures:,,,, ultrasound used (permanent image in chart),,    Narrative:  Start time: 04/24/2021 11:50 AM End time: 04/24/2021 11:59 AM Injection made incrementally with aspirations every 5 mL.  Performed by: Personally  Anesthesiologist: Lyn Hollingshead, MD

## 2021-04-24 NOTE — H&P (Signed)
Sande Sammuel Bailiff    Chief Complaint: Right shoulder osteoarthritis HPI: The patient is a 71 y.o. female with chronic and progressively increasing bilateral shoulder pain right much more symptomatic than the left with underlying severe osteoarthritis.  Due to her increasing functional limitations and failure to respond to prolonged attempts at conservative management she is brought to the operating room at this time for planned right shoulder anatomic arthroplasty  Past Medical History:  Diagnosis Date   Aortic insufficiency    trace   Cancer ([place])    thyroid right side   Coronary artery disease    Diabetes mellitus without complication ([place])    GERD (gastroesophageal reflux disease)    History of bronchitis 01/2018   Hyperlipidemia    Hypertension    Hypertensive heart disease    Hypothyroidism    Mitral insufficiency    trace   Moderate concentric left ventricular hypertrophy    Myocardial infarction ([place]) 07/01/2002   Osteoarthritis of left knee 02/02/2017   Osteoarthritis of left knee    Pneumonia    history of   PONV (postoperative nausea and vomiting)    Tricuspid insufficiency    trace    Past Surgical History:  Procedure Laterality Date   COLONOSCOPY     HEEL SPUR SURGERY     KNEE ARTHROPLASTY Left 02/25/2017   Procedure: LEFT TOTAL KNEE ARTHROPLASTY WITH COMPUTER NAVIGATION;  Surgeon: Rod Can, MD;  [facility];  Service: Orthopedics;  Laterality: Left;  Needs RNFA   KNEE ARTHROPLASTY Right 03/23/2018   Procedure: COMPUTER ASSISTED TOTAL KNEE ARTHROPLASTY;  Surgeon: Rod Can, MD;  Location[facility];  Service: Orthopedics;  Laterality: Right;   right wrist surgery     thumb fracture surgery     THYROIDECTOMY, PARTIAL     TONSILLECTOMY AND ADENOIDECTOMY     TUMOR REMOVAL     lieomyoma   UPPER GI ENDOSCOPY     VAGINAL HYSTERECTOMY      History reviewed. No pertinent family history.  Social History:  reports that she has quit smoking. She has  never used smokeless tobacco. She reports that she does not drink alcohol and does not use drugs.   Medications Prior to Admission  Medication Sig Dispense Refill   Calcium Carb-Cholecalciferol (CALCIUM 600/VITAMIN D3 PO) Take 1 tablet by mouth daily.     Cholecalciferol (VITAMIN D3) 125 MCG (5000 UT) CAPS Take 5,000 Units by mouth daily.     clopidogrel (PLAVIX) 75 MG tablet Take 75 mg by mouth daily.     esomeprazole (NEXIUM) 20 MG capsule Take 40 mg by mouth daily.      isosorbide mononitrate (IMDUR) 30 MG 24 hr tablet Take 30 mg by mouth every morning.     levothyroxine (SYNTHROID, LEVOTHROID) 100 MCG tablet Take 100 mcg by mouth daily before breakfast.     metFORMIN (GLUCOPHAGE) 500 MG tablet Take 500 mg by mouth 2 (two) times daily.      metoprolol tartrate (LOPRESSOR) 50 MG tablet Take 50 mg by mouth daily.     Multiple Vitamins-Minerals (MULTIVITAMIN PO) Take 1 tablet by mouth daily.     nabumetone (RELAFEN) 500 MG tablet Take 500 mg by mouth in the morning, at noon, and at bedtime.     rosuvastatin (CRESTOR) 5 MG tablet Take 5 mg by mouth daily.       Physical Exam: Right shoulder demonstrates painful and guarded motion as noted at recent office visits.  She demonstrates excellent strength to manual muscle  testing.  Preoperative x-rays show complete obliteration of joint space with subchondral sclerosis and peripheral osteophyte formation.  Recent MRI scan confirms the rotator cuff to be intact.  Vitals  Temp:  [98.6 F (37 C)] 98.6 F (37 C) (02/23 1026) Pulse Rate:  [73] 73 (02/23 1026) Resp:  [16] 16 (02/23 1026) BP: (188)/(86) 188/86 (02/23 1026) SpO2:  [98 %] 98 % (02/23 1026) Weight:  [75.8 kg] 75.8 kg (02/23 1036)  Assessment/Plan  Impression: Right shoulder osteoarthritis  Plan of Action: Procedure(s): TOTAL SHOULDER ARTHROPLASTY  Anaisha Mago M Arnol Mcgibbon 04/24/2021, 11:41 AM Contact # [phone]

## 2021-04-24 NOTE — Discharge Instructions (Signed)

## 2021-04-24 NOTE — Evaluation (Signed)
Occupational Therapy Evaluation Patient Details Name: Pamela Burns MRN: [id number] DOB: 07-Mar-1950 Today's Date: 04/24/2021   History of Present Illness Patient s/p right rTSA   Clinical Impression   [name] is a 71 year old woman s/p shoulder replacement without functional use of right dominant upper extremity secondary to effects of surgery and interscalene block and shoulder precautions. Therapist provided education and instruction to patient and spouse in regards to exercises, precautions, positioning, donning upper extremity clothing and bathing while maintaining shoulder precautions, ice and edema management and donning/doffing sling. Patient and spouse verbalized understanding and demonstrated as needed. Patient needed assistance to donn shirt, underwear, pants, socks and shoes and provided with instruction on compensatory strategies to perform ADLs. Patient having some pain during evaluation and sling did not fit properly. Donned larger sling and recommended patient to immediately place ice cuff once home. Patient to follow up with MD for further therapy needs.        Recommendations for follow up therapy are one component of a multi-disciplinary discharge planning process, led by the attending physician.  Recommendations may be updated based on patient status, additional functional criteria and insurance authorization.   Follow Up Recommendations  Follow physician's recommendations for discharge plan and follow up therapies    Assistance Recommended at Discharge Intermittent Supervision/Assistance  Patient can return home with the following A little help with walking and/or transfers;Assistance with cooking/housework    Functional Status Assessment  Patient has had a recent decline in their functional status and demonstrates the ability to make significant improvements in function in a reasonable and predictable amount of time.  Equipment Recommendations  None  recommended by OT    Recommendations for Other Services       Precautions / Restrictions Precautions Precautions: Shoulder Type of Shoulder Precautions: May come out of sling if sitting in controlled environment. ie while watching tv, eating etc to give neck and skin break from sling. Please sleep in sling though until 4 weeks post op.     Ok to use operative arm to assist in feeding, bathing, ADL's.       New PROM restrictions (8/18) for use in hygiene and ADL only   ER 20   ABD 45   FE 60     Pendulums are to be gentle and are the preferred exercise to be instructed for patients to perform at home.( Along with elbow wrist and hand exercise) Shoulder Interventions: Shoulder sling/immobilizer;Off for dressing/bathing/exercises Precaution Booklet Issued:  (handouts) Required Braces or Orthoses: Sling Restrictions Weight Bearing Restrictions: Yes RUE Weight Bearing: Non weight bearing      Mobility Bed Mobility Overal bed mobility: Independent                  Transfers Overall transfer level: Independent                        Balance Overall balance assessment: Independent                                         ADL either performed or assessed with clinical judgement   ADL  Vision Baseline Vision/History: 1 Wears glasses       Perception     Praxis      Pertinent Vitals/Pain Pain Assessment Pain Assessment: Faces Faces Pain Scale: Hurts little more Pain Location: R shoulder Pain Descriptors / Indicators: Aching, Grimacing, Tightness Pain Intervention(s): Monitored during session     Hand Dominance     Extremity/Trunk Assessment Upper Extremity Assessment Upper Extremity Assessment: RUE deficits/detail RUE Deficits / Details: impaired AROM secondary to block RUE Sensation: decreased light touch   Lower Extremity Assessment Lower Extremity Assessment: Overall  WFL for tasks assessed   Cervical / Trunk Assessment Cervical / Trunk Assessment: Normal   Communication     Cognition Arousal/Alertness: Awake/alert Behavior During Therapy: WFL for tasks assessed/performed Overall Cognitive Status: Within Functional Limits for tasks assessed                                       General Comments       Exercises     Shoulder Instructions Shoulder Instructions Donning/doffing shirt without moving shoulder: Caregiver independent with task Method for sponge bathing under operated UE: Independent Donning/doffing sling/immobilizer: Caregiver independent with task Correct positioning of sling/immobilizer: Caregiver independent with task Pendulum exercises (written home exercise program): Independent;Caregiver independent with task ROM for elbow, wrist and digits of operated UE: Independent Sling wearing schedule (on at all times/off for ADL's): Caregiver independent with task Proper positioning of operated UE when showering: Independent Dressing change: Independent Positioning of UE while sleeping: [place] expects to be discharged to:: Private residence Living Arrangements: Spouse/significant other Available Help at Discharge: Family;Available 24 hours/day                                    Prior Functioning/Environment                          OT Problem List: Decreased strength;Decreased range of motion;Impaired UE functional use;Pain      OT Treatment/Interventions:      OT Goals(Current goals can be found in the care plan section) Acute Rehab OT Goals OT Goal Formulation: All assessment and education complete, DC therapy  OT Frequency:      Co-evaluation              AM-PAC OT "6 Clicks" Daily Activity     Outcome Measure Help from another person eating meals?: A Little Help from another person taking care of personal grooming?: A Little Help from  another person toileting, which includes using toliet, bedpan, or urinal?: A Little Help from another person bathing (including washing, rinsing, drying)?: A Little Help from another person to put on and taking off regular upper body clothing?: A Lot Help from another person to put on and taking off regular lower body clothing?: A Lot 6 Click Score: 16   End of Session Nurse Communication:  (OT education complete)  Activity Tolerance: Patient tolerated treatment well Patient left: in chair;with family/visitor present  OT Visit Diagnosis: Muscle weakness (generalized) (M62.81);Pain                Time: 1550-1610 OT Time Calculation (min): 20 min Charges:  OT General Charges $OT Visit: 1 Visit OT Evaluation $OT Eval Low Complexity: 1 Low  Pamela Burns, OTR/L Acute Care Rehab  Services  Office [phone] Pager: [place] 04/24/2021, 4:18 PM

## 2021-04-24 NOTE — Transfer of Care (Signed)
Immediate Anesthesia Transfer of Care Note  Patient: Pamela Burns  Procedure(s) Performed: TOTAL SHOULDER ARTHROPLASTY (Right: Shoulder)  Patient Location: PACU  Anesthesia Type:General and Regional  Level of Consciousness: awake, alert  and oriented  Airway & Oxygen Therapy: Patient Spontanous Breathing and Patient connected to face mask  Post-op Assessment: Report given to RN and Post -op Vital signs reviewed and stable  Post vital signs: Reviewed and stable  Last Vitals:  Vitals Value Taken Time  BP 147/96 04/24/21 1445  Temp 36.4 C 04/24/21 1438  Pulse 98 04/24/21 1454  Resp 18 04/24/21 1454  SpO2 94 % 04/24/21 1454  Vitals shown include unvalidated device data.  Last Pain:  Vitals:   04/24/21 1445  TempSrc:   PainSc: 0-No pain         Complications: No notable events documented.

## 2021-04-25 ENCOUNTER — Encounter (HOSPITAL_COMMUNITY): Payer: Self-pay | Admitting: Orthopedic Surgery
